# Patient Record
Sex: Female | Born: 1975 | Race: Black or African American | Hispanic: No | Marital: Married | Smoking: Never smoker
Health system: Southern US, Community
[De-identification: ages and names within clinical notes are randomized; demographics above are authoritative.]

## PROBLEM LIST (undated history)

## (undated) DIAGNOSIS — N83209 Unspecified ovarian cyst, unspecified side: Secondary | ICD-10-CM

## (undated) DIAGNOSIS — E282 Polycystic ovarian syndrome: Secondary | ICD-10-CM

## (undated) HISTORY — PX: TUBAL LIGATION: SHX77

## (undated) HISTORY — PX: DILATION AND CURETTAGE OF UTERUS: SHX78

## (undated) HISTORY — PX: WISDOM TOOTH EXTRACTION: SHX21

## (undated) HISTORY — PX: CERVICAL CERCLAGE: SHX1329

## (undated) HISTORY — DX: Polycystic ovarian syndrome: E28.2

---

## 1998-02-20 ENCOUNTER — Emergency Department (HOSPITAL_COMMUNITY): Admission: EM | Admit: 1998-02-20 | Discharge: 1998-02-20 | Payer: Self-pay | Admitting: Emergency Medicine

## 1998-06-07 ENCOUNTER — Other Ambulatory Visit: Admission: RE | Admit: 1998-06-07 | Discharge: 1998-06-07 | Payer: Self-pay | Admitting: Obstetrics and Gynecology

## 2001-12-13 ENCOUNTER — Other Ambulatory Visit: Admission: RE | Admit: 2001-12-13 | Discharge: 2001-12-13 | Payer: Self-pay | Admitting: Obstetrics and Gynecology

## 2003-09-15 ENCOUNTER — Other Ambulatory Visit: Admission: RE | Admit: 2003-09-15 | Discharge: 2003-09-15 | Payer: Self-pay | Admitting: Obstetrics and Gynecology

## 2004-02-02 ENCOUNTER — Ambulatory Visit (HOSPITAL_COMMUNITY): Admission: RE | Admit: 2004-02-02 | Discharge: 2004-02-02 | Payer: Self-pay | Admitting: Obstetrics and Gynecology

## 2004-02-16 ENCOUNTER — Ambulatory Visit (HOSPITAL_COMMUNITY): Admission: RE | Admit: 2004-02-16 | Discharge: 2004-02-16 | Payer: Self-pay | Admitting: Obstetrics and Gynecology

## 2004-02-19 ENCOUNTER — Inpatient Hospital Stay (HOSPITAL_COMMUNITY): Admission: AD | Admit: 2004-02-19 | Discharge: 2004-02-28 | Payer: Self-pay | Admitting: Obstetrics and Gynecology

## 2004-03-25 ENCOUNTER — Observation Stay (HOSPITAL_COMMUNITY): Admission: RE | Admit: 2004-03-25 | Discharge: 2004-03-26 | Payer: Self-pay | Admitting: Obstetrics and Gynecology

## 2004-09-16 ENCOUNTER — Other Ambulatory Visit: Admission: RE | Admit: 2004-09-16 | Discharge: 2004-09-16 | Payer: Self-pay | Admitting: Obstetrics and Gynecology

## 2004-10-28 ENCOUNTER — Ambulatory Visit (HOSPITAL_COMMUNITY): Admission: RE | Admit: 2004-10-28 | Discharge: 2004-10-28 | Payer: Self-pay | Admitting: Obstetrics and Gynecology

## 2004-11-23 ENCOUNTER — Ambulatory Visit (HOSPITAL_COMMUNITY): Admission: RE | Admit: 2004-11-23 | Discharge: 2004-11-23 | Payer: Self-pay | Admitting: Obstetrics and Gynecology

## 2004-12-12 ENCOUNTER — Encounter (HOSPITAL_COMMUNITY): Admission: AD | Admit: 2004-12-12 | Discharge: 2005-01-11 | Payer: Self-pay | Admitting: Obstetrics and Gynecology

## 2005-01-16 ENCOUNTER — Encounter (HOSPITAL_COMMUNITY): Admission: RE | Admit: 2005-01-16 | Discharge: 2005-02-15 | Payer: Self-pay | Admitting: Obstetrics and Gynecology

## 2005-02-20 ENCOUNTER — Encounter (HOSPITAL_COMMUNITY): Admission: RE | Admit: 2005-02-20 | Discharge: 2005-03-13 | Payer: Self-pay | Admitting: Obstetrics and Gynecology

## 2005-03-20 ENCOUNTER — Encounter (HOSPITAL_COMMUNITY): Admission: RE | Admit: 2005-03-20 | Discharge: 2005-04-19 | Payer: Self-pay | Admitting: Obstetrics and Gynecology

## 2005-03-31 ENCOUNTER — Inpatient Hospital Stay (HOSPITAL_COMMUNITY): Admission: AD | Admit: 2005-03-31 | Discharge: 2005-03-31 | Payer: Self-pay | Admitting: Obstetrics and Gynecology

## 2005-04-19 ENCOUNTER — Inpatient Hospital Stay (HOSPITAL_COMMUNITY): Admission: AD | Admit: 2005-04-19 | Discharge: 2005-04-19 | Payer: Self-pay | Admitting: Obstetrics and Gynecology

## 2005-04-26 ENCOUNTER — Inpatient Hospital Stay (HOSPITAL_COMMUNITY): Admission: AD | Admit: 2005-04-26 | Discharge: 2005-04-26 | Payer: Self-pay | Admitting: Obstetrics and Gynecology

## 2005-05-06 ENCOUNTER — Inpatient Hospital Stay (HOSPITAL_COMMUNITY): Admission: AD | Admit: 2005-05-06 | Discharge: 2005-05-06 | Payer: Self-pay | Admitting: Obstetrics and Gynecology

## 2005-05-24 ENCOUNTER — Inpatient Hospital Stay (HOSPITAL_COMMUNITY): Admission: AD | Admit: 2005-05-24 | Discharge: 2005-05-26 | Payer: Self-pay | Admitting: Obstetrics and Gynecology

## 2005-09-12 ENCOUNTER — Other Ambulatory Visit: Admission: RE | Admit: 2005-09-12 | Discharge: 2005-09-12 | Payer: Self-pay | Admitting: Obstetrics and Gynecology

## 2005-11-21 ENCOUNTER — Ambulatory Visit (HOSPITAL_COMMUNITY): Admission: RE | Admit: 2005-11-21 | Discharge: 2005-11-21 | Payer: Self-pay | Admitting: Obstetrics and Gynecology

## 2006-04-08 ENCOUNTER — Inpatient Hospital Stay (HOSPITAL_COMMUNITY): Admission: AD | Admit: 2006-04-08 | Discharge: 2006-04-08 | Payer: Self-pay | Admitting: Obstetrics and Gynecology

## 2006-04-11 ENCOUNTER — Inpatient Hospital Stay (HOSPITAL_COMMUNITY): Admission: AD | Admit: 2006-04-11 | Discharge: 2006-04-11 | Payer: Self-pay | Admitting: Obstetrics and Gynecology

## 2006-04-12 ENCOUNTER — Inpatient Hospital Stay (HOSPITAL_COMMUNITY): Admission: AD | Admit: 2006-04-12 | Discharge: 2006-04-12 | Payer: Self-pay | Admitting: Obstetrics and Gynecology

## 2006-05-08 ENCOUNTER — Inpatient Hospital Stay (HOSPITAL_COMMUNITY): Admission: RE | Admit: 2006-05-08 | Discharge: 2006-05-10 | Payer: Self-pay | Admitting: Obstetrics and Gynecology

## 2011-07-05 ENCOUNTER — Encounter (HOSPITAL_COMMUNITY): Payer: Self-pay | Admitting: *Deleted

## 2011-07-05 ENCOUNTER — Ambulatory Visit (HOSPITAL_COMMUNITY)
Admission: RE | Admit: 2011-07-05 | Discharge: 2011-07-05 | Disposition: A | Discharge status: Home / Self Care | Payer: BC Managed Care – PPO | Source: Ambulatory Visit | Attending: Family Medicine | Admitting: Family Medicine

## 2011-07-05 ENCOUNTER — Ambulatory Visit (INDEPENDENT_AMBULATORY_CARE_PROVIDER_SITE_OTHER): Payer: BC Managed Care – PPO

## 2011-07-05 ENCOUNTER — Inpatient Hospital Stay (HOSPITAL_COMMUNITY): Payer: BC Managed Care – PPO

## 2011-07-05 ENCOUNTER — Other Ambulatory Visit: Payer: Self-pay | Admitting: Family Medicine

## 2011-07-05 ENCOUNTER — Inpatient Hospital Stay (HOSPITAL_COMMUNITY)
Admission: AD | Admit: 2011-07-05 | Discharge: 2011-07-05 | Disposition: A | Discharge status: Home / Self Care | Payer: BC Managed Care – PPO | Source: Ambulatory Visit | Attending: Obstetrics & Gynecology | Admitting: Obstetrics & Gynecology

## 2011-07-05 DIAGNOSIS — R1031 Right lower quadrant pain: Secondary | ICD-10-CM | POA: Insufficient documentation

## 2011-07-05 DIAGNOSIS — R109 Unspecified abdominal pain: Secondary | ICD-10-CM

## 2011-07-05 DIAGNOSIS — N838 Other noninflammatory disorders of ovary, fallopian tube and broad ligament: Secondary | ICD-10-CM | POA: Insufficient documentation

## 2011-07-05 DIAGNOSIS — R112 Nausea with vomiting, unspecified: Secondary | ICD-10-CM | POA: Insufficient documentation

## 2011-07-05 DIAGNOSIS — R1084 Generalized abdominal pain: Secondary | ICD-10-CM

## 2011-07-05 HISTORY — DX: Unspecified ovarian cyst, unspecified side: N83.209

## 2011-07-05 LAB — URINALYSIS, ROUTINE W REFLEX MICROSCOPIC
Bilirubin Urine: NEGATIVE
Glucose, UA: NEGATIVE mg/dL
Hgb urine dipstick: NEGATIVE
Ketones, ur: NEGATIVE mg/dL
Nitrite: NEGATIVE
Protein, ur: NEGATIVE mg/dL
Specific Gravity, Urine: <1.005 — ABNORMAL LOW (ref 1.005–1.030)
Urobilinogen, UA: 1 mg/dL (ref 0.0–1.0)
pH: 7 (ref 5.0–8.0)

## 2011-07-05 MED ORDER — IOHEXOL 300 MG/ML  SOLN: 100.0000 mL | Freq: Once | INTRAMUSCULAR | Status: DC | PRN

## 2011-07-05 MED ORDER — HYDROCODONE-ACETAMINOPHEN 5-500 MG PO TABS: 1.0000 | ORAL_TABLET | Freq: Four times a day (QID) | ORAL | Status: AC | PRN

## 2011-07-05 MED ORDER — HYDROMORPHONE HCL PF 1 MG/ML IJ SOLN
1.0000 mg | Freq: Once | INTRAMUSCULAR | Status: AC
  Administered 2011-07-05: 1 mg via INTRAMUSCULAR
  Filled 2011-07-05: qty 1

## 2011-07-05 MED ORDER — KETOROLAC TROMETHAMINE 60 MG/2ML IM SOLN
60.0000 mg | Freq: Once | INTRAMUSCULAR | Status: AC
  Administered 2011-07-05: 60 mg via INTRAMUSCULAR
  Filled 2011-07-05: qty 2

## 2011-07-05 NOTE — Progress Notes (Signed)
Patient states she has some right lower abdominal pain about 3 weeks ago then went away. Started again yesterday at 1530 with increasing intensity since that time. Was seen at [place] today and had a CT a [employer]. Was sent to [facility]U for evaluation of an ovarian cyst on the right side.

## 2011-07-05 NOTE — ED Provider Notes (Signed)
History   Pt presents today c/o RLQ pain that has worsened over the past 2 days. She was seen earlier today in Urgent Care and was worked up for appendicitis which was negative. However, the CT scan demonstrated a possible 5cm ovarian cyst and she was sent to the MAU to r/o ovarian torsion. She denies vag dc, bleeding, fever, or any other sx at this time.  Chief Complaint  Patient presents with  . Abdominal Pain   HPI  OB History    Grav Para Term Preterm Abortions TAB SAB Ect Mult Living   4 4 3 1   0 0 0 0 3      Past Medical History  Diagnosis Date  . Ovarian cyst     Past Surgical History  Procedure Date  . Tubal ligation   . Dilation and curettage of uterus   . Wisdom tooth extraction   . Cervical cerclage     X  3 pregnancies    History reviewed. No pertinent family history.  History  Substance Use Topics  . Smoking status: Never Smoker   . Smokeless tobacco: Never Used  . Alcohol Use: No    Allergies:  Allergies  Allergen Reactions  . Pineapple Swelling    Tongue and lips swell but she eats sometimes anyway.  . Shellfish Allergy Swelling    Tongue and lip swelling    No prescriptions prior to admission    Review of Systems  Constitutional: Negative for fever.  Eyes: Negative for blurred vision and double vision.  Cardiovascular: Negative for chest pain and palpitations.  Gastrointestinal: Positive for nausea and abdominal pain. Negative for vomiting, diarrhea and constipation.  Genitourinary: Negative for dysuria, urgency, frequency and hematuria.  Neurological: Negative for dizziness and headaches.  Psychiatric/Behavioral: Negative for depression and suicidal ideas.   Physical Exam   Blood pressure 116/76, pulse 75, temperature 98.4 F (36.9 C), temperature source Oral, resp. rate 20, height 5' 7.5" (1.715 m), weight 277 lb (125.646 kg), last menstrual period 06/14/2011, SpO2 99.00%.  Physical Exam  Nursing note and vitals  reviewed. Constitutional: She is oriented to person, place, and time. She appears well-developed and well-nourished. No distress.  HENT:  Head: Normocephalic and atraumatic.  Eyes: EOM are normal. Pupils are equal, round, and reactive to light.  GI: Soft. She exhibits no distension and no mass. There is tenderness. There is no rebound and no guarding.  Neurological: She is alert and oriented to person, place, and time.  Skin: Skin is warm and dry. She is not diaphoretic.  Psychiatric: She has a normal mood and affect. Her behavior is normal. Judgment and thought content normal.    MAU Course  Procedures  Results for orders placed during the hospital encounter of 07/05/11 (from the past 24 hour(s))  URINALYSIS, ROUTINE W REFLEX MICROSCOPIC     Status: Abnormal   Collection Time   07/05/11  5:00 PM      Component Value Range   Color, Urine YELLOW  YELLOW    APPearance HAZY (*) CLEAR    Specific Gravity, Urine <1.005 (*) 1.005 - 1.030    pH 7.0  5.0 - 8.0    Glucose, UA NEGATIVE  NEGATIVE (mg/dL)   Hgb urine dipstick NEGATIVE  NEGATIVE    Bilirubin Urine NEGATIVE  NEGATIVE    Ketones, ur NEGATIVE  NEGATIVE (mg/dL)   Protein, ur NEGATIVE  NEGATIVE (mg/dL)   Urobilinogen, UA 1.0  0.0 - 1.0 (mg/dL)   Nitrite NEGATIVE  NEGATIVE  Leukocytes, UA SMALL (*) NEGATIVE   URINE MICROSCOPIC-ADD ON     Status: Abnormal   Collection Time   07/05/11  5:00 PM      Component Value Range   Squamous Epithelial / LPF MANY (*) RARE    WBC, UA 0-2  <3 (WBC/hpf)   Bacteria, UA FEW (*) RARE     US Transvaginal Non-ob  07/05/2011  *RADIOLOGY REPORT*  Clinical Data: Pelvic pain.  TRANSABDOMINAL AND TRANSVAGINAL ULTRASOUND OF PELVIS Technique:  Both transabdominal and transvaginal ultrasound examinations of the pelvis were performed. Transabdominal technique was performed for global imaging of the pelvis including uterus, ovaries, adnexal regions, and pelvic cul-de-sac.  Comparison: CT 07/05/2011   It was  necessary to proceed with endovaginal exam following the transabdominal exam to visualize the both ovaries and uterus.  Findings:  Uterus: 9.6 x 4.9 x 6.5 cm.  Normal appearing uterus.  Endometrium: Tri layered endometrium measuring 10 mm  Right ovary:  The right ovary is located anteriorly as seen on the CT.  Right ovary measures 5.3 x 3.6 x 3.9 cm with multiple follicles.  No cyst or mass.  Left ovary: 5.5 x 1.8 x 2.7 cm.  Multiple follicles without cyst.  Other findings: No free fluid  IMPRESSION: Prominent sized ovaries bilaterally with multiple follicles.  No cyst or mass.  No free fluid.  Original Report Authenticated By: Camelia Phenes, M.D.   US Pelvis Complete  07/05/2011  *RADIOLOGY REPORT*  Clinical Data: Pelvic pain.  TRANSABDOMINAL AND TRANSVAGINAL ULTRASOUND OF PELVIS Technique:  Both transabdominal and transvaginal ultrasound examinations of the pelvis were performed. Transabdominal technique was performed for global imaging of the pelvis including uterus, ovaries, adnexal regions, and pelvic cul-de-sac.  Comparison: CT 07/05/2011   It was necessary to proceed with endovaginal exam following the transabdominal exam to visualize the both ovaries and uterus.  Findings:  Uterus: 9.6 x 4.9 x 6.5 cm.  Normal appearing uterus.  Endometrium: Tri layered endometrium measuring 10 mm  Right ovary:  The right ovary is located anteriorly as seen on the CT.  Right ovary measures 5.3 x 3.6 x 3.9 cm with multiple follicles.  No cyst or mass.  Left ovary: 5.5 x 1.8 x 2.7 cm.  Multiple follicles without cyst.  Other findings: No free fluid  IMPRESSION: Prominent sized ovaries bilaterally with multiple follicles.  No cyst or mass.  No free fluid.  Original Report Authenticated By: Camelia Phenes, M.D.   Ct Abdomen Pelvis W Contrast  07/05/2011  *RADIOLOGY REPORT*  Clinical Data: Right lower quadrant abdominal pain for 2 days, nausea, vomiting, question appendicitis  CT ABDOMEN AND PELVIS WITH CONTRAST  Technique:   Multidetector CT imaging of the abdomen and pelvis was performed following the standard protocol during bolus administration of intravenous contrast. Sagittal and coronal MPR images reconstructed from axial data set.  Contrast:  100 ml Omnipaque 300 IV. Dilute oral contrast.  Comparison: None  Findings: Lung bases clear. Liver, spleen, pancreas, kidneys, and adrenal glands normal. Normal appendix. Bladder, ureters, uterus, and left adnexa normal appearance. Enlargement of right ovary question containing a cyst or mass 5.1 x 3.3 x 3.6 cm. Tiny amount of free intraperitoneal fluid adjacent to uterus. No additional intra abdominal mass, adenopathy, or inflammatory process. Bones unremarkable.  IMPRESSION: No evidence of appendicitis. Enlarged right ovary question containing a cyst or mass 5.1 x 3.3 x 3.6 cm in size. Recommend characterization by pelvic and transvaginal sonography.  Original Report Authenticated By: Redge Gainer.  Tyron Russell, M.D.     Assessment and Plan  Abd pain: pt most likely with abd muscle strain. Her [place] and labs are NL. CBC from urgent care NL. Will give Rx for vicodin. She will f/u with her PCP. Discussed diet, activity, risks, and precautions.  Clinton Gallant. Rice III, DrHSc, MPAS, PA-C  07/05/2011, 5:55 PM   Henrietta Hoover, PA 07/05/11 1947

## 2011-07-05 NOTE — ED Notes (Signed)
Patient has not been seen by Dr. Normand Sloop for several years, so patient reassigned to [facility]. Informed patient that she may choose to reestablished care with Dr. Normand Sloop at her convenience.

## 2014-04-20 ENCOUNTER — Encounter (HOSPITAL_COMMUNITY): Payer: Self-pay | Admitting: *Deleted

## 2018-04-04 ENCOUNTER — Other Ambulatory Visit: Payer: Self-pay

## 2018-04-04 ENCOUNTER — Encounter: Payer: Self-pay | Admitting: Family Medicine

## 2018-04-04 ENCOUNTER — Ambulatory Visit: Payer: BC Managed Care – PPO | Admitting: Family Medicine

## 2018-04-04 VITALS — BP 118/88 | HR 75 | Temp 97.9°F | Ht 67.5 in | Wt 274.6 lb

## 2018-04-04 DIAGNOSIS — B9789 Other viral agents as the cause of diseases classified elsewhere: Secondary | ICD-10-CM

## 2018-04-04 DIAGNOSIS — J028 Acute pharyngitis due to other specified organisms: Secondary | ICD-10-CM | POA: Diagnosis not present

## 2018-04-04 DIAGNOSIS — Z23 Encounter for immunization: Secondary | ICD-10-CM | POA: Diagnosis not present

## 2018-04-04 DIAGNOSIS — J029 Acute pharyngitis, unspecified: Secondary | ICD-10-CM

## 2018-04-04 NOTE — Patient Instructions (Signed)
Please return in 2 weeks for your physical.   It was a pleasure meeting you today! Thank you for choosing [place] to meet your healthcare needs! I truly look forward to working with you. If you have any questions or concerns, please send me a message via Mychart or call the office at [phone].   Pharyngitis Pharyngitis is redness, pain, and swelling (inflammation) of the throat (pharynx). It is a very common cause of sore throat. Pharyngitis can be caused by a bacteria, but it is usually caused by a virus. Most cases of pharyngitis get better on their own without treatment. What are the causes? This condition may be caused by:  Infection by viruses (viral). Viral pharyngitis spreads from person to person (is contagious) through coughing, sneezing, and sharing of personal items or utensils such as cups, forks, spoons, and toothbrushes.  Infection by bacteria (bacterial). Bacterial pharyngitis may be spread by touching the nose or face after coming in contact with the bacteria, or through more intimate contact, such as kissing.  Allergies. Allergies can cause buildup of mucus in the throat (post-nasal drip), leading to inflammation and irritation. Allergies can also cause blocked nasal passages, forcing breathing through the mouth, which dries and irritates the throat.  What increases the risk? You are more likely to develop this condition if:  You are 52-34 years old.  You are exposed to crowded environments such as daycare, school, or dormitory living.  You live in a cold climate.  You have a weakened disease-fighting (immune) system.  What are the signs or symptoms? Symptoms of this condition vary by the cause (viral, bacterial, or allergies) and can include:  Sore throat.  Fatigue.  Low-grade fever.  Headache.  Joint pain and muscle aches.  Skin rashes.  Swollen glands in the throat (lymph nodes).  Plaque-like film on the throat or tonsils. This is often a symptom of  bacterial pharyngitis.  Vomiting.  Stuffy nose (nasal congestion).  Cough.  Red, itchy eyes (conjunctivitis).  Loss of appetite.  How is this diagnosed? This condition is often diagnosed based on your medical history and a physical exam. Your health care provider will ask you questions about your illness and your symptoms. A swab of your throat may be done to check for bacteria (rapid strep test). Other lab tests may also be done, depending on the suspected cause, but these are rare. How is this treated? This condition usually gets better in 3-4 days without medicine. Bacterial pharyngitis may be treated with antibiotic medicines. Follow these instructions at home:  Take over-the-counter and prescription medicines only as told by your health care provider. ? If you were prescribed an antibiotic medicine, take it as told by your health care provider. Do not stop taking the antibiotic even if you start to feel better. ? Do not give children aspirin because of the association with Reye syndrome.  Drink enough water and fluids to keep your urine clear or pale yellow.  Get a lot of rest.  Gargle with a salt-water mixture 3-4 times a day or as needed. To make a salt-water mixture, completely dissolve -1 tsp of salt in 1 cup of warm water.  If your health care provider approves, you may use throat lozenges or sprays to soothe your throat. Contact a health care provider if:  You have large, tender lumps in your neck.  You have a rash.  You cough up green, yellow-brown, or bloody spit. Get help right away if:  Your neck becomes stiff.  You drool or are unable to swallow liquids.  You cannot drink or take medicines without vomiting.  You have severe pain that does not go away, even after you take medicine.  You have trouble breathing, and it is not caused by a stuffy nose.  You have new pain and swelling in your joints such as the knees, ankles, wrists, or  elbows. Summary  Pharyngitis is redness, pain, and swelling (inflammation) of the throat (pharynx).  While pharyngitis can be caused by a bacteria, the most common causes are viral.  Most cases of pharyngitis get better on their own without treatment.  Bacterial pharyngitis is treated with antibiotic medicines. This information is not intended to replace advice given to you by your health care provider. Make sure you discuss any questions you have with your health care provider. Document Released: 06/05/2005 Document Revised: 07/11/2016 Document Reviewed: 07/11/2016 Elsevier Interactive Patient Education  [employer].

## 2018-04-04 NOTE — Progress Notes (Signed)
Subjective  CC:  Chief Complaint  Patient presents with  . Sore Throat    started on saturday, felt like a rock in her throat, she has been taking tylenol     HPI: Erica Baxter is a 42 y.o. female who presents to [facility] at [facility] today to establish care with me as a new patient.    She has the following concerns or needs:  Very pleasant 42 year old female, mother of 3 children, married presents due to recent sore throat.  Started with severe sore throat that lasted multiple days.  Now improving.  Responsive to Tylenol.  No fevers or chills.  No cough or URI symptoms.  No known strep or mono exposure.  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  Happy, well adjusted.  Overdue for health maintenance.  Assessment  1. Acute viral pharyngitis      Plan   Supportive care.  Reassured.  Improving.  Flu shot today  Return for complete physical  Follow up:  Return in about 2 weeks (around 04/18/2018) for complete physical. No orders of the defined types were placed in this encounter.  No orders of the defined types were placed in this encounter.    Depression screen PHQ 2/9 04/04/2018  Decreased Interest 0  Down, Depressed, Hopeless 0  PHQ - 2 Score 0    We updated and reviewed the patient's past history in detail and it is documented below.  There are no active problems to display for this patient.  Health Maintenance  Topic Date Due  . HIV Screening  08/21/1992  . TETANUS/TDAP  08/21/1996  . PAP SMEAR  08/22/1998  . INFLUENZA VACCINE  01/17/2018    There is no immunization history on file for this patient. No outpatient medications have been marked as taking for the 04/04/18 encounter (Office Visit) with Willow Ora, MD.    Allergies: Patient is allergic to pineapple and shellfish allergy. Past Medical History Patient  has a past medical history of Ovarian cyst. Past Surgical History Patient  has a past  surgical history that includes Tubal ligation; Dilation and curettage of uterus; Wisdom tooth extraction; and Cervical cerclage. Family History: Patient family history includes Asthma in her daughter and mother; COPD in her mother; Depression in her brother and mother; Diabetes in her mother and paternal grandmother; Early death in her mother; Heart attack in her son; Heart disease in her paternal grandfather; Prostate cancer in her maternal grandfather. Social History:  Patient  reports that she has never smoked. She has never used smokeless tobacco. She reports that she does not drink alcohol or use drugs.  Review of Systems: Constitutional: negative for fever or malaise Ophthalmic: negative for photophobia, double vision or loss of vision Cardiovascular: negative for chest pain, dyspnea on exertion, or new LE swelling Respiratory: negative for SOB or persistent cough Gastrointestinal: negative for abdominal pain, change in bowel habits or melena Genitourinary: negative for dysuria or gross hematuria Musculoskeletal: negative for new gait disturbance or muscular weakness Integumentary: negative for new or persistent rashes Neurological: negative for TIA or stroke symptoms Psychiatric: negative for SI or delusions Allergic/Immunologic: negative for hives  Patient Care Team    Relationship Specialty Notifications Start End  Willow Ora, MD PCP - General Family Medicine  04/04/18     Objective  Vitals: BP 118/88   Pulse 75   Temp 97.9 F (36.6 C)   Ht 5' 7.5" (1.715 m)  Wt 274 lb 9.6 oz (124.6 kg)   SpO2 97%   BMI 42.37 kg/m  General:  Well developed, well nourished, no acute distress  Psych:  Alert and oriented,normal mood and affect HEENT:  Normocephalic, atraumatic, non-icteric sclera, PERRL, oropharynx is without mass or exudate and is mildly erythematous, supple neck without adenopathy, mass or thyromegaly Cardiovascular:  RRR without gallop, rub or murmur, nondisplaced  PMI Respiratory:  Good breath sounds bilaterally, CTAB with normal respiratory effort    Commons side effects, risks, benefits, and alternatives for medications and treatment plan prescribed today were discussed, and the patient expressed understanding of the given instructions. Patient is instructed to call or message via MyChart if he/she has any questions or concerns regarding our treatment plan. No barriers to understanding were identified. We discussed Red Flag symptoms and signs in detail. Patient expressed understanding regarding what to do in case of urgent or emergency type symptoms.   Medication list was reconciled, printed and provided to the patient in AVS. Patient instructions and summary information was reviewed with the patient as documented in the AVS. This note was prepared with assistance of Dragon voice recognition software. Occasional wrong-word or sound-a-like substitutions may have occurred due to the inherent limitations of voice recognition software

## 2018-04-16 ENCOUNTER — Ambulatory Visit: Payer: BC Managed Care – PPO | Admitting: Family Medicine

## 2018-04-22 ENCOUNTER — Ambulatory Visit: Payer: BC Managed Care – PPO | Admitting: Family Medicine

## 2018-04-22 ENCOUNTER — Encounter: Payer: Self-pay | Admitting: Family Medicine

## 2018-04-22 ENCOUNTER — Other Ambulatory Visit (HOSPITAL_COMMUNITY)
Admission: RE | Admit: 2018-04-22 | Discharge: 2018-04-22 | Disposition: A | Discharge status: Home / Self Care | Payer: BC Managed Care – PPO | Source: Ambulatory Visit | Attending: Family Medicine | Admitting: Family Medicine

## 2018-04-22 ENCOUNTER — Other Ambulatory Visit: Payer: Self-pay

## 2018-04-22 VITALS — BP 126/84 | HR 82 | Temp 97.9°F | Ht 67.5 in | Wt 274.8 lb

## 2018-04-22 DIAGNOSIS — Z0000 Encounter for general adult medical examination without abnormal findings: Secondary | ICD-10-CM | POA: Insufficient documentation

## 2018-04-22 DIAGNOSIS — Z124 Encounter for screening for malignant neoplasm of cervix: Secondary | ICD-10-CM

## 2018-04-22 DIAGNOSIS — Z1239 Encounter for other screening for malignant neoplasm of breast: Secondary | ICD-10-CM

## 2018-04-22 DIAGNOSIS — M2141 Flat foot [pes planus] (acquired), right foot: Secondary | ICD-10-CM

## 2018-04-22 DIAGNOSIS — E282 Polycystic ovarian syndrome: Secondary | ICD-10-CM | POA: Diagnosis not present

## 2018-04-22 DIAGNOSIS — M2142 Flat foot [pes planus] (acquired), left foot: Secondary | ICD-10-CM

## 2018-04-22 DIAGNOSIS — M2140 Flat foot [pes planus] (acquired), unspecified foot: Secondary | ICD-10-CM | POA: Insufficient documentation

## 2018-04-22 NOTE — Patient Instructions (Signed)
Please return in 12 months for your annual complete physical; please come fasting.  Work on sleep hygiene and calming the mind.   If you have any questions or concerns, please don't hesitate to send me a message via MyChart or call the office at [phone]. Thank you for visiting with [place] today! It's our pleasure caring for you.  Please do these things to maintain good health!   Exercise at least 30-45 minutes a day,  4-5 days a week.   Eat a low-fat diet with lots of fruits and vegetables, up to 7-9 servings per day.  Drink plenty of water daily. Try to drink 8 8oz glasses per day.  Seatbelts can save your life. Always wear your seatbelt.  Place Smoke Detectors on every level of your home and check batteries every year.  Schedule an appointment with an eye doctor for an eye exam every 1-2 years  Safe sex - use condoms to protect yourself from STDs if you could be exposed to these types of infections. Use birth control if you do not want to become pregnant and are sexually active.  Avoid heavy alcohol use. If you drink, keep it to less than 2 drinks/day and not every day.  Health Care Power of Attorney.  Choose someone you trust that could speak for you if you became unable to speak for yourself.  Depression is common in our stressful world.If you're feeling down or losing interest in things you normally enjoy, please come in for a visit.  If anyone is threatening or hurting you, please get help. Physical or Emotional Violence is never OK.

## 2018-04-22 NOTE — Progress Notes (Signed)
Subjective  Chief Complaint  Patient presents with  . Establish Care    never had a PCP, last physical in 2008  . Annual Exam    pap today, due for mammogram, tdap today, patient is not fasting today     HPI: Erica Baxter is a 42 y.o. female who presents to [facility] today for a Female Wellness Visit.   Wellness Visit: annual visit with health maintenance review and exam with Pap   Doing well. Admits to some trouble sleeping at night: awakens thinking of plans for the next day. Falls alseep ok. Limits working at night and TV. No mood issues. Denies snoring.   Due for pap, mammo and labs.  Assessment  1. Annual physical exam   2. Cervical cancer screening   3. Breast cancer screening   4. PCOS (polycystic ovarian syndrome)   5. Pes planus of both feet      Plan  Female Wellness Visit:  Age appropriate Health Maintenance and Prevention measures were discussed with patient. Included topics are cancer screening recommendations, ways to keep healthy (see AVS) including dietary and exercise recommendations, regular eye and dental care, use of seat belts, and avoidance of moderate alcohol use and tobacco use.   BMI: discussed patient's BMI and encouraged positive lifestyle modifications to help get to or maintain a target BMI.  HM needs and immunizations were addressed and ordered. See below for orders. See HM and immunization section for updates. Needs tdap  Routine labs and screening tests ordered including cmp, cbc and lipids where appropriate.  Discussed recommendations regarding Vit D and calcium supplementation (see AVS)    Follow up: Return in about 1 year (around 04/23/2019) for complete physical.   Orders Placed This Encounter  Procedures  . MM Digital Screening  . CBC with Differential/Platelet  . Comprehensive metabolic panel  . Lipid panel  . HIV Antibody (routine testing w rflx)   No orders of the defined types were placed  in this encounter.     Lifestyle: Body mass index is 42.4 kg/m. Wt Readings from Last 3 Encounters:  04/22/18 274 lb 12.8 oz (124.6 kg)  04/04/18 274 lb 9.6 oz (124.6 kg)  07/05/11 277 lb (125.6 kg)   Diet: general Exercise: daily,  Need for contraception: No, tubal ligation  Patient Active Problem List   Diagnosis Date Noted  . Annual physical exam 04/22/2018  . PCOS (polycystic ovarian syndrome) 04/22/2018  . Pes planus 04/22/2018   Health Maintenance  Topic Date Due  . HIV Screening  08/21/1992  . MAMMOGRAM  08/22/1995  . TETANUS/TDAP  08/21/1996  . PAP SMEAR  08/22/1998  . INFLUENZA VACCINE  Completed   Immunization History  Administered Date(s) Administered  . Influenza,inj,Quad PF,6+ Mos 04/04/2018   We updated and reviewed the patient's past history in detail and it is documented below. Allergies: Patient  reports that she does not drink alcohol. Past Medical History Patient  has a past medical history of Ovarian cyst. Past Surgical History Patient  has a past surgical history that includes Tubal ligation; Dilation and curettage of uterus; Wisdom tooth extraction; and Cervical cerclage. Social History   Socioeconomic History  . Marital status: Married    Spouse name: Not on file  . Number of children: Not on file  . Years of education: Not on file  . Highest education level: Not on file  Occupational History  . Not on file  Social Needs  . Physicist, medical  strain: Not on file  . Food insecurity:    Worry: Not on file    Inability: Not on file  . Transportation needs:    Medical: Not on file    Non-medical: Not on file  Tobacco Use  . Smoking status: Never Smoker  . Smokeless tobacco: Never Used  Substance and Sexual Activity  . Alcohol use: No  . Drug use: No  . Sexual activity: Yes    Birth control/protection: Surgical  Lifestyle  . Physical activity:    Days per week: Not on file    Minutes per session: Not on file  . Stress: Not on  file  Relationships  . Social connections:    Talks on phone: Not on file    Gets together: Not on file    Attends religious service: Not on file    Active member of club or organization: Not on file    Attends meetings of clubs or organizations: Not on file    Relationship status: Not on file  Other Topics Concern  . Not on file  Social History Narrative  . Not on file   Family History  Problem Relation Age of Onset  . Asthma Mother   . COPD Mother   . Depression Mother   . Diabetes Mother   . Early death Mother   . Depression Brother   . Prostate cancer Maternal Grandfather   . Asthma Daughter   . Heart attack Son   . Diabetes Paternal Grandmother   . Heart disease Paternal Grandfather     Review of Systems: Constitutional: negative for fever or malaise Ophthalmic: negative for photophobia, double vision or loss of vision Cardiovascular: negative for chest pain, dyspnea on exertion, or new LE swelling Respiratory: negative for SOB or persistent cough Gastrointestinal: negative for abdominal pain, change in bowel habits or melena Genitourinary: negative for dysuria or gross hematuria, no abnormal uterine bleeding or disharge Musculoskeletal: negative for new gait disturbance or muscular weakness Integumentary: negative for new or persistent rashes, no breast lumps Neurological: negative for TIA or stroke symptoms Psychiatric: negative for SI or delusions Allergic/Immunologic: negative for hives  Patient Care Team    Relationship Specialty Notifications Start End  Willow Ora, MD PCP - General Family Medicine  04/22/18     Objective  Vitals: BP 126/84   Pulse 82   Temp 97.9 F (36.6 C)   Ht 5' 7.5" (1.715 m)   Wt 274 lb 12.8 oz (124.6 kg)   LMP 04/08/2018   SpO2 99%   BMI 42.40 kg/m  General:  Well developed, well nourished, no acute distress  Psych:  Alert and orientedx3,normal mood and affect HEENT:  Normocephalic, atraumatic, non-icteric sclera,  PERRL, oropharynx is clear without mass or exudate, supple neck without adenopathy, mass or thyromegaly Cardiovascular:  Normal S1, S2, RRR without gallop, rub or murmur, nondisplaced PMI Respiratory:  Good breath sounds bilaterally, CTAB with normal respiratory effort Gastrointestinal: normal bowel sounds, soft, non-tender, no noted masses. No HSM MSK: no deformities, contusions. Joints are without erythema or swelling. Spine and CVA region are nontender Skin:  Warm, no rashes or suspicious lesions noted, hirsuitism on chin Neurologic:    Mental status is normal. CN 2-11 are normal. Gross motor and sensory exams are normal. Normal gait. No tremor Breast Exam: No mass, skin retraction or nipple discharge is appreciated in either breast. No axillary adenopathy. Fibrocystic changes are not noted Pelvic Exam: Normal external genitalia, no vulvar or vaginal lesions present.  Clear cervix w/o CMT. Bimanual exam reveals a nontender fundus w/o masses, nl size. No adnexal masses present. No inguinal adenopathy. A PAP smear was performed.     Commons side effects, risks, benefits, and alternatives for medications and treatment plan prescribed today were discussed, and the patient expressed understanding of the given instructions. Patient is instructed to call or message via MyChart if he/she has any questions or concerns regarding our treatment plan. No barriers to understanding were identified. We discussed Red Flag symptoms and signs in detail. Patient expressed understanding regarding what to do in case of urgent or emergency type symptoms.   Medication list was reconciled, printed and provided to the patient in AVS. Patient instructions and summary information was reviewed with the patient as documented in the AVS. This note was prepared with assistance of Dragon voice recognition software. Occasional wrong-word or sound-a-like substitutions may have occurred due to the inherent limitations of voice  recognition software

## 2018-04-23 LAB — COMPREHENSIVE METABOLIC PANEL
ALT: 8 U/L (ref 0–35)
AST: 11 U/L (ref 0–37)
Albumin: 4.2 g/dL (ref 3.5–5.2)
Alkaline Phosphatase: 55 U/L (ref 39–117)
BUN: 10 mg/dL (ref 6–23)
CHLORIDE: 104 meq/L (ref 96–112)
CO2: 26 mEq/L (ref 19–32)
Calcium: 9 mg/dL (ref 8.4–10.5)
Creatinine, Ser: 0.87 mg/dL (ref 0.40–1.20)
GFR: 92.43 mL/min (ref >60.00)
GLUCOSE: 71 mg/dL (ref 70–99)
Potassium: 3.5 mEq/L (ref 3.5–5.1)
SODIUM: 138 meq/L (ref 135–145)
Total Bilirubin: 0.3 mg/dL (ref 0.2–1.2)
Total Protein: 7.4 g/dL (ref 6.0–8.3)

## 2018-04-23 LAB — CBC WITH DIFFERENTIAL/PLATELET
BASOS PCT: 2.7 % (ref 0.0–3.0)
Basophils Absolute: 0.1 10*3/uL (ref 0.0–0.1)
EOS PCT: 1.2 % (ref 0.0–5.0)
Eosinophils Absolute: 0.1 10*3/uL (ref 0.0–0.7)
HCT: 28.7 % — ABNORMAL LOW (ref 36.0–46.0)
Hemoglobin: 8.6 g/dL — ABNORMAL LOW (ref 12.0–15.0)
LYMPHS ABS: 1.8 10*3/uL (ref 0.7–4.0)
Lymphocytes Relative: 33.8 % (ref 12.0–46.0)
MCHC: 30 g/dL (ref 30.0–36.0)
MCV: 63.9 fl — ABNORMAL LOW (ref 78.0–100.0)
Monocytes Absolute: 0.5 10*3/uL (ref 0.1–1.0)
Monocytes Relative: 8.6 % (ref 3.0–12.0)
NEUTROS PCT: 53.7 % (ref 43.0–77.0)
Neutro Abs: 2.9 10*3/uL (ref 1.4–7.7)
PLATELETS: 411 10*3/uL — AB (ref 150.0–400.0)
RBC: 4.49 Mil/uL (ref 3.87–5.11)
RDW: 18.8 % — AB (ref 11.5–15.5)
WBC: 5.4 10*3/uL (ref 4.0–10.5)

## 2018-04-23 LAB — LIPID PANEL
CHOLESTEROL: 133 mg/dL (ref 0–200)
HDL: 41.3 mg/dL (ref >39.00)
LDL CALC: 76 mg/dL (ref 0–99)
NONHDL: 91.82
Total CHOL/HDL Ratio: 3
Triglycerides: 77 mg/dL (ref 0.0–149.0)
VLDL: 15.4 mg/dL (ref 0.0–40.0)

## 2018-04-23 LAB — HIV ANTIBODY (ROUTINE TESTING W REFLEX): HIV: NONREACTIVE

## 2018-04-24 LAB — CYTOLOGY - PAP
Diagnosis: NEGATIVE
HPV: NOT DETECTED

## 2018-04-25 NOTE — Progress Notes (Signed)
Please call patient: I have reviewed his/her lab results. Most all labs are fine, including pap smear. We can repeat your pap in 5 years. However, she has a significant anemia, likely due to menstrual blood loss. Please start iron 2-3x/day and schedule a repeat office visit in 8 weeks to discuss and recheck blood counts. Her hgb is is 8.6 with > 12 being normal.   (at f/u, consider TVUS)

## 2018-04-29 ENCOUNTER — Ambulatory Visit
Admission: RE | Admit: 2018-04-29 | Discharge: 2018-04-29 | Disposition: A | Discharge status: Home / Self Care | Payer: BC Managed Care – PPO | Source: Ambulatory Visit | Attending: Family Medicine | Admitting: Family Medicine

## 2018-04-29 DIAGNOSIS — Z1239 Encounter for other screening for malignant neoplasm of breast: Secondary | ICD-10-CM

## 2018-05-31 ENCOUNTER — Ambulatory Visit: Payer: BC Managed Care – PPO | Admitting: Family Medicine

## 2018-05-31 ENCOUNTER — Encounter: Payer: Self-pay | Admitting: Family Medicine

## 2018-05-31 ENCOUNTER — Other Ambulatory Visit: Payer: Self-pay

## 2018-05-31 VITALS — BP 124/82 | HR 76 | Temp 97.9°F | Ht 67.5 in | Wt 273.8 lb

## 2018-05-31 DIAGNOSIS — D500 Iron deficiency anemia secondary to blood loss (chronic): Secondary | ICD-10-CM | POA: Diagnosis not present

## 2018-05-31 DIAGNOSIS — Z23 Encounter for immunization: Secondary | ICD-10-CM | POA: Diagnosis not present

## 2018-05-31 DIAGNOSIS — N920 Excessive and frequent menstruation with regular cycle: Secondary | ICD-10-CM

## 2018-05-31 MED ORDER — POLYSACCHARIDE IRON COMPLEX 150 MG PO CAPS: 150.0000 mg | ORAL_CAPSULE | Freq: Two times a day (BID) | ORAL | 3 refills | Status: DC

## 2018-05-31 NOTE — Progress Notes (Signed)
Subjective  CC:  Chief Complaint  Patient presents with  . Anemia    recheck Anemia, doing well, no complaints    HPI: Erica Baxter is a 42 y.o. female who presents to the office today to address the problems listed above in the chief complaint.  Erica Baxter returns for follow-up of her microcytic anemia thought related heavy regular periods for many years.  She was told to take over-the-counter iron 2-3 times daily however was only able to get iron in once every 2 to 3 days.  She is not good at remembering taking daily medications.  She has had anemia after an IUFD requiring dilatation and curettage and a transfusion.  Since she had 2 normal pregnancies.  Never has been told she has been anemic since.  Has not had routine care for multiple years however.  Feels well.  Energy level is good.  She does admit to getting tired in the afternoon at times.  She denies pelvic pain.  Assessment  1. Iron deficiency anemia due to chronic blood loss   2. Menorrhagia with regular cycle   3. Need for Tdap vaccination      Plan   Iron deficiency anemia: Education and counseling given.  Presumed due to chronic blood loss.  Recommend Niferex 150 mg twice daily and rechecking blood counts in 8 weeks.  Check iron levels, vitamin B12 and folate today.  Discussed heavy menstrual cycles and management strategies.  Patient would prefer not to have to take oral medications or use hormones.  Recommend referral to GYN for further evaluation, and possible endometrial ablation.  First will order transvaginal ultrasound looking for fibroids Updated Tdap today Praised for weight loss. Follow up: 8 weeks to recheck anemia  Orders Placed This Encounter  Procedures  . US Pelvic Complete With Transvaginal  . Tdap vaccine greater than or equal to 7yo IM  . Anemia Profile B  . Ambulatory referral to Obstetrics / Gynecology   Meds ordered this encounter  Medications  . iron polysaccharides (NIFEREX) 150 MG capsule     Sig: Take 1 capsule (150 mg total) by mouth 2 (two) times daily.    Dispense:  60 capsule    Refill:  3      I reviewed the patients updated PMH, FH, and SocHx.    Patient Active Problem List   Diagnosis Date Noted  . Annual physical exam 04/22/2018  . PCOS (polycystic ovarian syndrome) 04/22/2018  . Pes planus 04/22/2018   No outpatient medications have been marked as taking for the 05/31/18 encounter (Office Visit) with Willow Ora, MD.    Allergies: Patient is allergic to pineapple and shellfish allergy. Family History: Patient family history includes Asthma in her daughter and mother; COPD in her mother; Depression in her brother and mother; Diabetes in her mother and paternal grandmother; Early death in her mother; Heart attack in her son; Heart disease in her paternal grandfather; Prostate cancer in her maternal grandfather. Social History:  Patient  reports that she has never smoked. She has never used smokeless tobacco. She reports that she does not drink alcohol or use drugs.  Review of Systems: Constitutional: Negative for fever malaise or anorexia Cardiovascular: negative for chest pain Respiratory: negative for SOB or persistent cough Gastrointestinal: negative for abdominal pain  Objective  Vitals: BP 124/82   Pulse 76   Temp 97.9 F (36.6 C)   Ht 5' 7.5" (1.715 m)   Wt 273 lb 12.8 oz (124.2 kg)  BMI 42.25 kg/m  General: no acute distress , A&Ox3 HEENT: PEERL, conjunctiva pale, Oropharynx moist,neck is supple Cardiovascular:  RRR without murmur or gallop.,  No peripheral edema Respiratory:  Good breath sounds bilaterally, CTAB with normal respiratory effort Skin:  Warm, no rashes  No visits with results within 1 Day(s) from this visit.  Latest known visit with results is:  Office Visit on 04/22/2018  Component Date Value Ref Range Status  . Adequacy 04/22/2018 Satisfactory for evaluation  endocervical/transformation zone component PRESENT.    Final  . Diagnosis 04/22/2018 NEGATIVE FOR INTRAEPITHELIAL LESIONS OR MALIGNANCY.   Final  . HPV 04/22/2018 NOT DETECTED   Final  . Material Submitted 04/22/2018 CervicoVaginal Pap [ThinPrep Imaged]   Final  . WBC 04/22/2018 5.4  4.0 - 10.5 K/uL Final  . RBC 04/22/2018 4.49  3.87 - 5.11 Mil/uL Final  . Hemoglobin 04/22/2018 8.6 Repeated and verified X2.* 12.0 - 15.0 g/dL Final  . HCT 16/10/960411/09/2017 28.7* 36.0 - 46.0 % Final  . MCV 04/22/2018 63.9 Repeated and verified X2.* 78.0 - 100.0 fl Final  . MCHC 04/22/2018 30.0  30.0 - 36.0 g/dL Final  . RDW 54/09/811911/09/2017 18.8* 11.5 - 15.5 % Final  . Platelets 04/22/2018 411.0* 150.0 - 400.0 K/uL Final  . Neutrophils Relative % 04/22/2018 53.7  43.0 - 77.0 % Final  . Lymphocytes Relative 04/22/2018 33.8  12.0 - 46.0 % Final  . Monocytes Relative 04/22/2018 8.6  3.0 - 12.0 % Final  . Eosinophils Relative 04/22/2018 1.2  0.0 - 5.0 % Final  . Basophils Relative 04/22/2018 2.7  0.0 - 3.0 % Final  . Neutro Abs 04/22/2018 2.9  1.4 - 7.7 K/uL Final  . Lymphs Abs 04/22/2018 1.8  0.7 - 4.0 K/uL Final  . Monocytes Absolute 04/22/2018 0.5  0.1 - 1.0 K/uL Final  . Eosinophils Absolute 04/22/2018 0.1  0.0 - 0.7 K/uL Final  . Basophils Absolute 04/22/2018 0.1  0.0 - 0.1 K/uL Final  . Sodium 04/22/2018 138  135 - 145 mEq/L Final  . Potassium 04/22/2018 3.5  3.5 - 5.1 mEq/L Final  . Chloride 04/22/2018 104  96 - 112 mEq/L Final  . CO2 04/22/2018 26  19 - 32 mEq/L Final  . Glucose, Bld 04/22/2018 71  70 - 99 mg/dL Final  . BUN 14/78/295611/09/2017 10  6 - 23 mg/dL Final  . Creatinine, Ser 04/22/2018 0.87  0.40 - 1.20 mg/dL Final  . Total Bilirubin 04/22/2018 0.3  0.2 - 1.2 mg/dL Final  . Alkaline Phosphatase 04/22/2018 55  39 - 117 U/L Final  . AST 04/22/2018 11  0 - 37 U/L Final  . ALT 04/22/2018 8  0 - 35 U/L Final  . Total Protein 04/22/2018 7.4  6.0 - 8.3 g/dL Final  . Albumin 21/30/865711/09/2017 4.2  3.5 - 5.2 g/dL Final  . Calcium 84/69/629511/09/2017 9.0  8.4 - 10.5 mg/dL Final  . GFR  28/41/324411/09/2017 92.43  >60.00 mL/min Final  . Cholesterol 04/22/2018 133  0 - 200 mg/dL Final  . Triglycerides 04/22/2018 77.0  0.0 - 149.0 mg/dL Final  . HDL 01/02/725311/09/2017 41.30  >39.00 mg/dL Final  . VLDL 66/44/034711/09/2017 15.4  0.0 - 40.0 mg/dL Final  . LDL Cholesterol 04/22/2018 76  0 - 99 mg/dL Final  . Total CHOL/HDL Ratio 04/22/2018 3   Final  . NonHDL 04/22/2018 91.82   Final  . HIV 1&2 Ab, 4th Generation 04/22/2018 NON-REACTIVE  NON-REACTI Final      Commons side effects, risks, benefits,  and alternatives for medications and treatment plan prescribed today were discussed, and the patient expressed understanding of the given instructions. Patient is instructed to call or message via MyChart if he/she has any questions or concerns regarding our treatment plan. No barriers to understanding were identified. We discussed Red Flag symptoms and signs in detail. Patient expressed understanding regarding what to do in case of urgent or emergency type symptoms.   Medication list was reconciled, printed and provided to the patient in AVS. Patient instructions and summary information was reviewed with the patient as documented in the AVS. This note was prepared with assistance of Dragon voice recognition software. Occasional wrong-word or sound-a-like substitutions may have occurred due to the inherent limitations of voice recognition software

## 2018-05-31 NOTE — Patient Instructions (Signed)
Please return in 8 weeks to recheck your anemia.  Take your iron twice daily as discussed. Eat iron rich foods.   We will get you set up for an ultrasound of your uterus and I have referred you to Dr. Crawford Givens to discuss ways to manage your heavy cycles, possible endometrial ablation.   Today you were given your Tdap vaccination.   If you have any questions or concerns, please don't hesitate to send me a message via MyChart or call the office at [phone]. Thank you for visiting with Erica Baxter today! It's our pleasure caring for you.   Iron-Rich Diet Iron is a mineral that helps your body to produce hemoglobin. Hemoglobin is a protein in your red blood cells that carries oxygen to your body's tissues. Eating too little iron may cause you to feel weak and tired, and it can increase your risk for infection. Eating enough iron is necessary for your body's metabolism, muscle function, and nervous system. Iron is naturally found in many foods. It can also be added to foods or fortified in foods. There are two types of dietary iron:  Heme iron. Heme iron is absorbed by the body more easily than nonheme iron. Heme iron is found in meat, poultry, and fish.  Nonheme iron. Nonheme iron is found in dietary supplements, iron-fortified grains, beans, and vegetables.  You may need to follow an iron-rich diet if:  You have been diagnosed with iron deficiency or iron-deficiency anemia.  You have a condition that prevents you from absorbing dietary iron, such as: ? Infection in your intestines. ? Celiac disease. This involves long-lasting (chronic) inflammation of your intestines.  You do not eat enough iron.  You eat a diet that is high in foods that impair iron absorption.  You have lost a lot of blood.  You have heavy bleeding during your menstrual cycle.  You are pregnant.  What is my plan? Your health care provider may help you to determine how much iron you need per day based on your  condition. Generally, when a person consumes sufficient amounts of iron in the diet, the following iron needs are met:  Men. ? 28-71 years old: 11 mg per day. ? 70-84 years old: 8 mg per day.  Women. ? 2-94 years old: 15 mg per day. ? 48-40 years old: 18 mg per day. ? Over 28 years old: 8 mg per day. ? Pregnant women: 27 mg per day. ? Breastfeeding women: 9 mg per day.  What do I need to know about an iron-rich diet?  Eat fresh fruits and vegetables that are high in vitamin C along with foods that are high in iron. This will help increase the amount of iron that your body absorbs from food, especially with foods containing nonheme iron. Foods that are high in vitamin C include oranges, peppers, tomatoes, and mango.  Take iron supplements only as directed by your health care provider. Overdose of iron can be life-threatening. If you were prescribed iron supplements, take them with orange juice or a vitamin C supplement.  Cook foods in pots and pans that are made from iron.  Eat nonheme iron-containing foods alongside foods that are high in heme iron. This helps to improve your iron absorption.  Certain foods and drinks contain compounds that impair iron absorption. Avoid eating these foods in the same meal as iron-rich foods or with iron supplements. These include: ? Coffee, black tea, and red wine. ? Milk, dairy products, and foods that are  high in calcium. ? Beans, soybeans, and peas. ? Whole grains.  When eating foods that contain both nonheme iron and compounds that impair iron absorption, follow these tips to absorb iron better. ? Soak beans overnight before cooking. ? Soak whole grains overnight and drain them before using. ? Ferment flours before baking, such as using yeast in bread dough. What foods can I eat? Grains Iron-fortified breakfast cereal. Iron-fortified whole-wheat bread. Enriched rice. Sprouted grains. Vegetables Spinach. Potatoes with skin. Green peas.  Broccoli. Red and green bell peppers. Fermented vegetables. Fruits Prunes. Raisins. Oranges. Strawberries. Mango. Grapefruit. Meats and Other Protein Sources Beef liver. Oysters. Beef. Shrimp. [place]. Chicken. [place]. Sardines. Chickpeas. Nuts. Tofu. Beverages Tomato juice. Fresh orange juice. Prune juice. Hibiscus tea. Fortified instant breakfast shakes. Condiments Tahini. Fermented soy sauce. Sweets and Desserts Black-strap molasses. Other Wheat germ. The items listed above may not be a complete list of recommended foods or beverages. Contact your dietitian for more options. What foods are not recommended? Grains Whole grains. Bran cereal. Bran flour. Oats. Vegetables Artichokes. Brussels sprouts. Kale. Fruits Blueberries. Raspberries. Strawberries. Figs. Meats and Other Protein Sources Soybeans. Products made from soy protein. Dairy Milk. Cream. Cheese. Yogurt. Cottage cheese. Beverages Coffee. Black tea. Red wine. Sweets and Desserts Cocoa. Chocolate. Ice cream. Other Basil. Oregano. Parsley. The items listed above may not be a complete list of foods and beverages to avoid. Contact your dietitian for more information. This information is not intended to replace advice given to you by your health care provider. Make sure you discuss any questions you have with your health care provider. Document Released: 01/17/2005 Document Revised: 12/24/2015 Document Reviewed: 12/31/2013 Elsevier Interactive Patient Education  2018 [employer].  Iron Deficiency Anemia, Adult Iron-deficiency anemia is when you have a low amount of red blood cells or hemoglobin. This happens because you have too little iron in your body. Hemoglobin carries oxygen to parts of the body. Anemia can cause your body to not get enough oxygen. It may or may not cause symptoms. Follow these instructions at home: Medicines  Take over-the-counter and prescription medicines only as told by your doctor. This includes  iron pills (supplements) and vitamins.  If you cannot handle taking iron pills by mouth, ask your doctor about getting iron through: ? A vein (intravenously). ? A shot (injection) into a muscle.  Take iron pills when your stomach is empty. If you cannot handle this, take them with food.  Do not drink milk or take antacids at the same time as your iron pills.  To prevent trouble pooping (constipation), eat fiber or take medicine (stool softener) as told by your doctor. Eating and drinking  Talk with your doctor before changing the foods you eat. He or she may tell you to eat foods that have a lot of iron, such as: ? Liver. ? Lowfat (lean) beef. ? Breads and cereals that have iron added to them (fortified breads and cereals). ? Eggs. ? Dried fruit. ? Dark green, leafy vegetables.  Drink enough fluid to keep your pee (urine) clear or pale yellow.  Eat fresh fruits and vegetables that are high in vitamin C. They help your body to use iron. Foods with a lot of vitamin C include: ? Oranges. ? Peppers. ? Tomatoes. ? Mangoes. General instructions  Return to your normal activities as told by your doctor. Ask your doctor what activities are safe for you.  Keep yourself clean, and keep things clean around you (your surroundings). Anemia can make you  get sick more easily.  Keep all follow-up visits as told by your doctor. This is important. Contact a doctor if:  You feel sick to your stomach (nauseous).  You throw up (vomit).  You feel weak.  You are sweating for no clear reason.  You have trouble pooping, such as: ? Pooping (having a bowel movement) less than 3 times a week. ? Straining to poop. ? Having poop that is hard, dry, or larger than normal. ? Feeling full or bloated. ? Pain in the lower belly. ? Not feeling better after pooping. Get help right away if:  You pass out (faint). If this happens, do not drive yourself to the hospital. Call your local emergency services  (911 in the [place]  You have chest pain.  You have shortness of breath that: ? Is very bad. ? Gets worse with physical activity.  You have a fast heartbeat.  You get light-headed when getting up from sitting or lying down. This information is not intended to replace advice given to you by your health care provider. Make sure you discuss any questions you have with your health care provider. Document Released: 07/08/2010 Document Revised: 02/23/2016 Document Reviewed: 02/23/2016 Elsevier Interactive Patient Education  2017 [employer].

## 2018-06-01 LAB — ANEMIA PROFILE B
%SAT: 24 % (ref 16–45)
ABS Retic: 51480 cells/uL (ref 20000–8000)
BASOS ABS: 59 {cells}/uL (ref 0–200)
Basophils Relative: 1.4 %
EOS PCT: 2.6 %
Eosinophils Absolute: 109 cells/uL (ref 15–500)
Ferritin: 6 ng/mL — ABNORMAL LOW (ref 16–154)
Folate: 3.6 ng/mL — ABNORMAL LOW
HEMATOCRIT: 29.4 % — AB (ref 35.0–45.0)
Hemoglobin: 8.4 g/dL — ABNORMAL LOW (ref 11.7–15.5)
Iron: 103 ug/dL (ref 40–190)
Lymphs Abs: 1743 cells/uL (ref 850–3900)
MCH: 19.6 pg — AB (ref 27.0–33.0)
MCHC: 28.6 g/dL — AB (ref 32.0–36.0)
MCV: 68.5 fL — AB (ref 80.0–100.0)
MPV: 10 fL (ref 7.5–12.5)
Monocytes Relative: 6.6 %
NEUTROS PCT: 47.9 %
Neutro Abs: 2012 cells/uL (ref 1500–7800)
Platelets: 350 10*3/uL (ref 140–400)
RBC: 4.29 10*6/uL (ref 3.80–5.10)
RDW: 17.2 % — ABNORMAL HIGH (ref 11.0–15.0)
RETIC CT PCT: 1.2 %
TIBC: 424 mcg/dL (calc) (ref 250–450)
TOTAL LYMPHOCYTE: 41.5 %
VITAMIN B 12: 556 pg/mL (ref 200–1100)
WBC: 4.2 10*3/uL (ref 3.8–10.8)
WBCMIX: 277 {cells}/uL (ref 200–950)

## 2018-06-03 ENCOUNTER — Encounter: Payer: Self-pay | Admitting: Family Medicine

## 2018-06-03 DIAGNOSIS — E538 Deficiency of other specified B group vitamins: Secondary | ICD-10-CM

## 2018-06-03 DIAGNOSIS — D500 Iron deficiency anemia secondary to blood loss (chronic): Secondary | ICD-10-CM | POA: Insufficient documentation

## 2018-06-03 HISTORY — DX: Deficiency of other specified B group vitamins: E53.8

## 2018-06-03 NOTE — Progress Notes (Signed)
Please call patient: I have reviewed his/her lab results. Iron remains low: to take niferex iron capsules twice a day and we will recheck in 8 weeks. Also with low folate levels. Please take folate OTC 800 - daily.

## 2018-06-06 ENCOUNTER — Ambulatory Visit
Admission: RE | Admit: 2018-06-06 | Discharge: 2018-06-06 | Disposition: A | Discharge status: Home / Self Care | Payer: BC Managed Care – PPO | Source: Ambulatory Visit | Attending: Family Medicine | Admitting: Family Medicine

## 2018-06-06 DIAGNOSIS — N920 Excessive and frequent menstruation with regular cycle: Secondary | ICD-10-CM

## 2018-06-06 DIAGNOSIS — D500 Iron deficiency anemia secondary to blood loss (chronic): Secondary | ICD-10-CM

## 2018-06-07 NOTE — Progress Notes (Signed)
Please call patient: I have reviewed his/her results. Ultrasound looks ok, no definitive fibroids or mass. The GYN will have it for review. Continue iron therapy. thanks

## 2018-06-19 HISTORY — PX: ENDOMETRIAL ABLATION: SHX621

## 2018-08-08 ENCOUNTER — Ambulatory Visit: Payer: BC Managed Care – PPO | Admitting: Family Medicine

## 2018-08-15 ENCOUNTER — Ambulatory Visit: Payer: BC Managed Care – PPO | Admitting: Family Medicine

## 2018-08-19 ENCOUNTER — Ambulatory Visit: Payer: BC Managed Care – PPO | Admitting: Family Medicine

## 2018-08-19 ENCOUNTER — Other Ambulatory Visit: Payer: Self-pay

## 2018-08-19 ENCOUNTER — Encounter: Payer: Self-pay | Admitting: Family Medicine

## 2018-08-19 VITALS — BP 126/84 | HR 97 | Temp 98.1°F | Resp 16 | Ht 64.0 in | Wt 280.2 lb

## 2018-08-19 DIAGNOSIS — E538 Deficiency of other specified B group vitamins: Secondary | ICD-10-CM | POA: Diagnosis not present

## 2018-08-19 DIAGNOSIS — D500 Iron deficiency anemia secondary to blood loss (chronic): Secondary | ICD-10-CM | POA: Diagnosis not present

## 2018-08-19 NOTE — Patient Instructions (Signed)
Please follow up if symptoms do not improve or as needed.   Good luck on your ablation. That will help!  I will release your lab results to you on your MyChart account with further instructions. Please reply with any questions.

## 2018-08-19 NOTE — Progress Notes (Signed)
Subjective  CC:  Chief Complaint  Patient presents with  . Anemia    HPI: Erica Baxter is a 43 y.o. female who presents to the office today to address the problems listed above in the chief complaint.  Erica Baxter returns for follow-up on her iron deficiency anemia due to chronic blood loss and mild folate deficiency.  Since her last visit in December she has done much better taking her Niferex pills twice a day.  She tries to get the folate pill in it as well.  She is seeing gynecology who will perform an endometrial ablation next week.  She feels well.  She was asymptomatic from an anemic standpoint.  No chest pain, palpitations, shortness of breath or lower extremity edema.  Tolerating the iron well  Lab Results  Component Value Date   WBC 4.2 05/31/2018   HGB 8.4 (L) 05/31/2018   HCT 29.4 (L) 05/31/2018   MCV 68.5 (L) 05/31/2018   PLT 350 05/31/2018   Lab Results  Component Value Date   IRON 103 05/31/2018   TIBC 424 05/31/2018   FERRITIN 6 (L) 05/31/2018    Assessment  1. Iron deficiency anemia due to chronic blood loss   2. Folate deficiency      Plan   Iron deficiency anemia with component of folate deficiency: Has completed 8 weeks of supplementation.  Recheck level today and will address further supplement needs from there.  Agree with endometrial ablation to correct menorrhagia.  Follow up: PRN Visit date not found  Orders Placed This Encounter  Procedures  . CBC with Differential/Platelet  . Iron, TIBC and Ferritin Panel  . Folate RBC   No orders of the defined types were placed in this encounter.     I reviewed the patients updated PMH, FH, and SocHx.    Patient Active Problem List   Diagnosis Date Noted  . Iron deficiency anemia due to chronic blood loss 06/03/2018  . Folate deficiency 06/03/2018  . Annual physical exam 04/22/2018  . PCOS (polycystic ovarian syndrome) 04/22/2018  . Pes planus 04/22/2018   Current Meds  Medication Sig  . iron  polysaccharides (NIFEREX) 150 MG capsule Take 1 capsule (150 mg total) by mouth 2 (two) times daily.    Allergies: Patient is allergic to pineapple and shellfish allergy. Family History: Patient family history includes Asthma in her daughter and mother; COPD in her mother; Depression in her brother and mother; Diabetes in her mother and paternal grandmother; Early death in her mother; Heart attack in her son; Heart disease in her paternal grandfather; Prostate cancer in her maternal grandfather. Social History:  Patient  reports that she has never smoked. She has never used smokeless tobacco. She reports that she does not drink alcohol or use drugs.  Review of Systems: Constitutional: Negative for fever malaise or anorexia Cardiovascular: negative for chest pain Respiratory: negative for SOB or persistent cough Gastrointestinal: negative for abdominal pain  Objective  Vitals: BP 126/84   Pulse 97   Temp 98.1 F (36.7 C) (Oral)   Resp 16   Ht 5\' 4"  (1.626 m)   Wt 280 lb 3.2 oz (127.1 kg)   LMP 08/05/2018   SpO2 97%   BMI 48.10 kg/m  General: no acute distress , A&Ox3 HEENT: PEERL, conjunctiva normal, Oropharynx moist,neck is supple Cardiovascular:  RRR without murmur or gallop.  Respiratory:  Good breath sounds bilaterally, CTAB with normal respiratory effort Skin:  Warm, no rashes     Commons side  effects, risks, benefits, and alternatives for medications and treatment plan prescribed today were discussed, and the patient expressed understanding of the given instructions. Patient is instructed to call or message via MyChart if he/she has any questions or concerns regarding our treatment plan. No barriers to understanding were identified. We discussed Red Flag symptoms and signs in detail. Patient expressed understanding regarding what to do in case of urgent or emergency type symptoms.   Medication list was reconciled, printed and provided to the patient in AVS. Patient  instructions and summary information was reviewed with the patient as documented in the AVS. This note was prepared with assistance of Dragon voice recognition software. Occasional wrong-word or sound-a-like substitutions may have occurred due to the inherent limitations of voice recognition software

## 2018-08-20 LAB — CBC WITH DIFFERENTIAL/PLATELET
BASOS ABS: 0.1 10*3/uL (ref 0.0–0.1)
Basophils Relative: 1.1 % (ref 0.0–3.0)
Eosinophils Absolute: 0.1 10*3/uL (ref 0.0–0.7)
Eosinophils Relative: 1.5 % (ref 0.0–5.0)
HEMATOCRIT: 29.5 % — AB (ref 36.0–46.0)
HEMOGLOBIN: 9.3 g/dL — AB (ref 12.0–15.0)
LYMPHS PCT: 32 % (ref 12.0–46.0)
Lymphs Abs: 1.6 10*3/uL (ref 0.7–4.0)
MCHC: 31.6 g/dL (ref 30.0–36.0)
MCV: 67.6 fl — ABNORMAL LOW (ref 78.0–100.0)
MONOS PCT: 5.6 % (ref 3.0–12.0)
Monocytes Absolute: 0.3 10*3/uL (ref 0.1–1.0)
Neutro Abs: 3 10*3/uL (ref 1.4–7.7)
Neutrophils Relative %: 59.8 % (ref 43.0–77.0)
PLATELETS: 324 10*3/uL (ref 150.0–400.0)
RBC: 4.37 Mil/uL (ref 3.87–5.11)
RDW: 17.9 % — ABNORMAL HIGH (ref 11.5–15.5)
WBC: 5 10*3/uL (ref 4.0–10.5)

## 2018-08-20 LAB — IRON,TIBC AND FERRITIN PANEL
%SAT: 4 % — AB (ref 16–45)
FERRITIN: 5 ng/mL — AB (ref 16–154)
Iron: 17 ug/dL — ABNORMAL LOW (ref 40–190)
TIBC: 392 ug/dL (ref 250–450)

## 2018-08-20 LAB — FOLATE RBC: RBC Folate: 745 ng/mL RBC (ref >280)

## 2019-02-22 IMAGING — MG DIGITAL SCREENING BILATERAL MAMMOGRAM WITH TOMO AND CAD
6 of 10 series · 6 of 30 positions shown · non-contrast
Comparison: None.

CLINICAL DATA: Screening.

EXAM:
DIGITAL SCREENING BILATERAL MAMMOGRAM WITH TOMO AND CAD

[L MLO synth-2D]
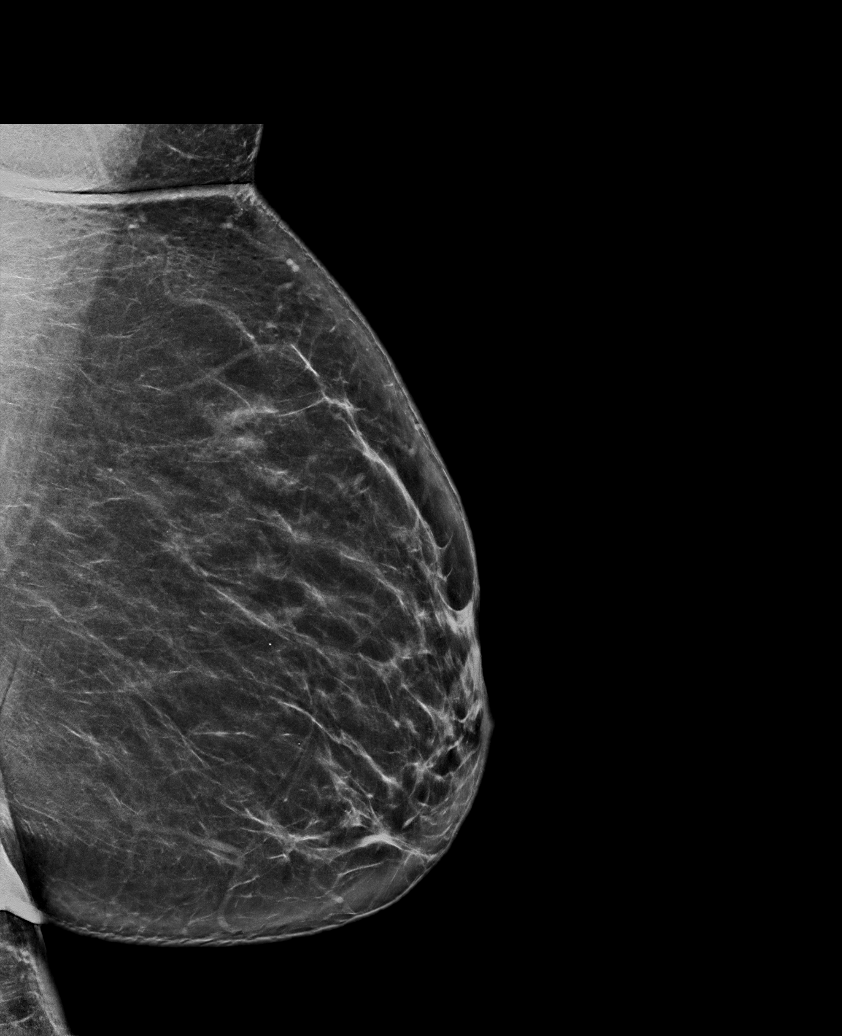

[L CC synth-2D]
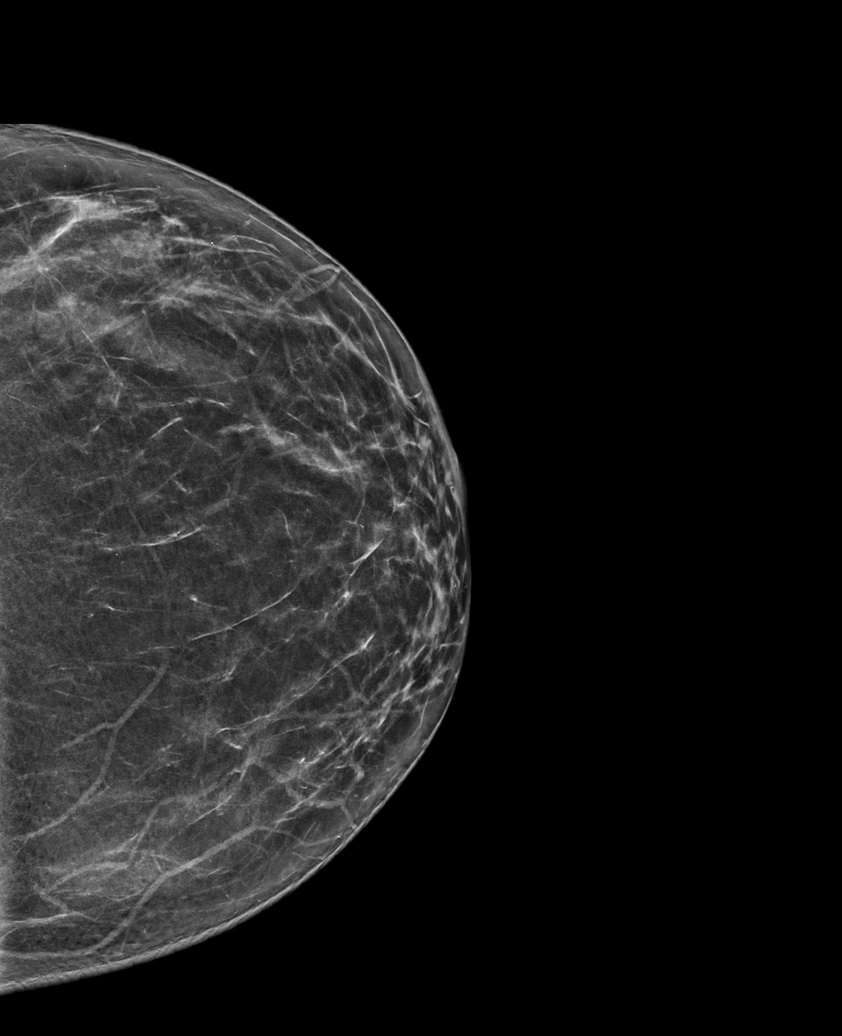

[R MLO synth-2D (1 of 2)]
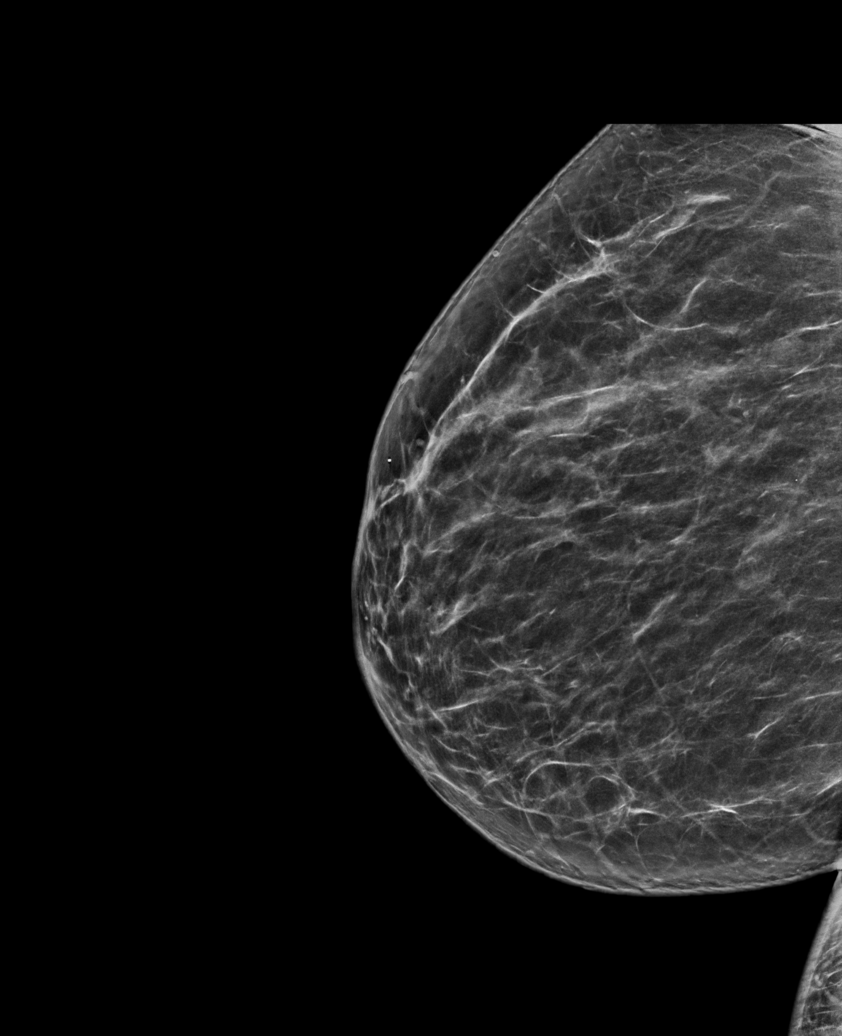

[R CC synth-2D]
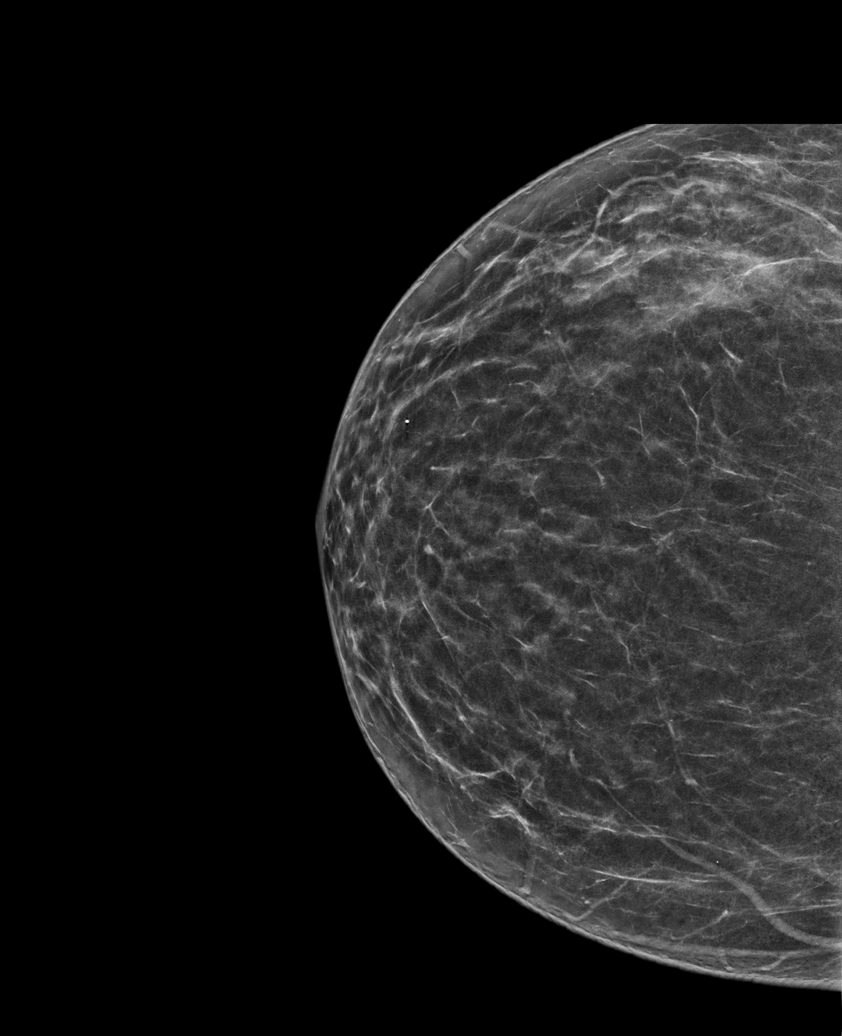

[R MLO synth-2D (2 of 2)]
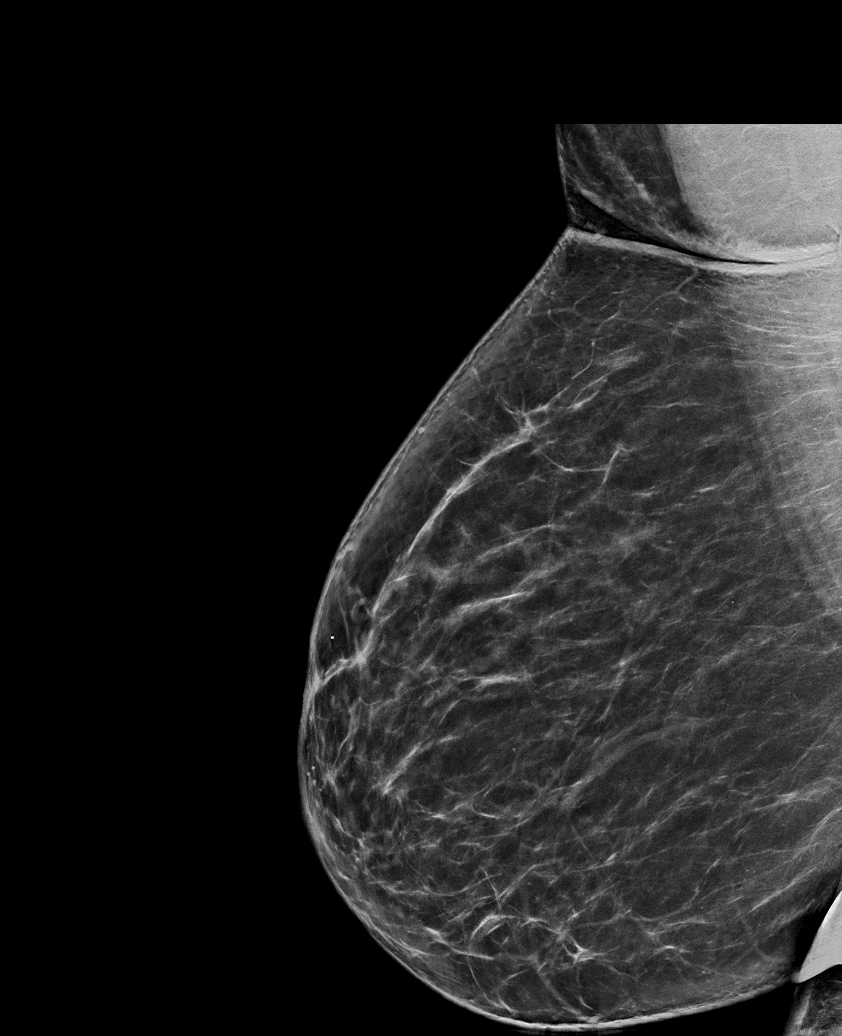

[R MLO tomo · tomo slice 38/75.0]
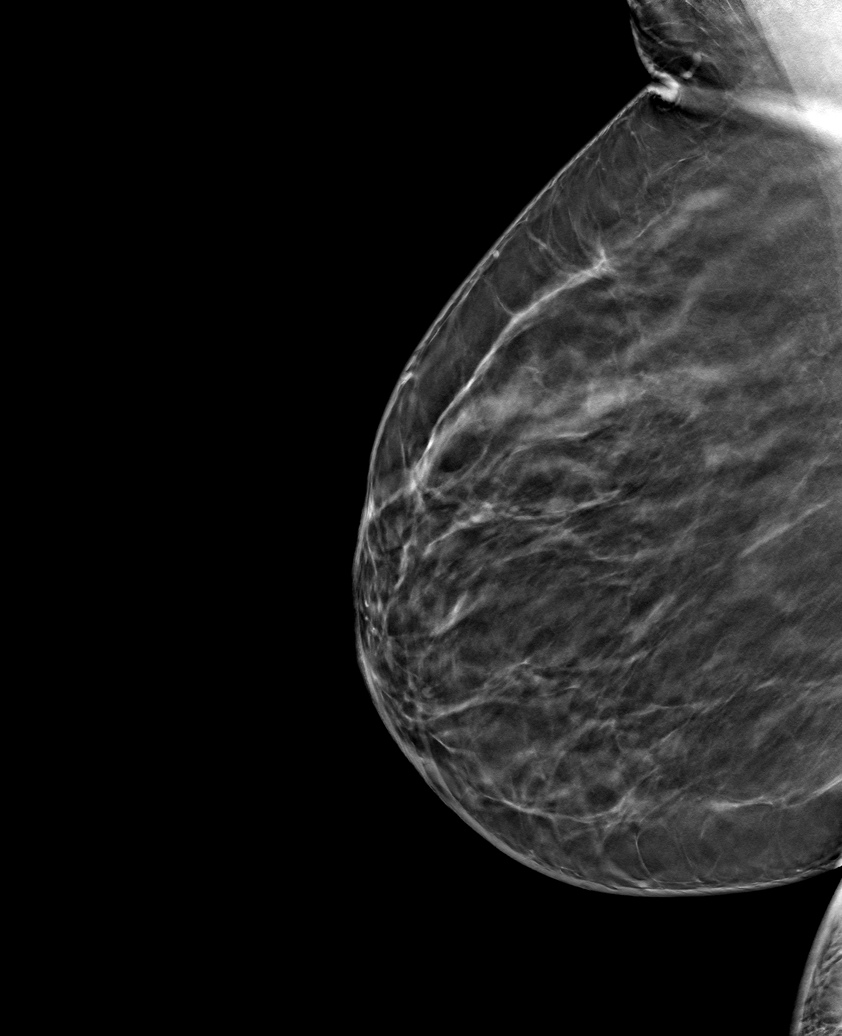

[6 of 30 positions shown; findings below may reference images not displayed]

ACR Breast Density Category b: There are scattered areas of
fibroglandular density.
FINDINGS: There are no findings suspicious for malignancy. Images were
processed with CAD.
IMPRESSION: No mammographic evidence of malignancy. A result letter of this
screening mammogram will be mailed directly to the patient.

RECOMMENDATION:
Screening mammogram in one year. (Code:Y5-G-EJ6)

BI-RADS CATEGORY  1: Negative.

## 2019-06-27 ENCOUNTER — Ambulatory Visit: Payer: BC Managed Care – PPO | Attending: Internal Medicine

## 2019-06-27 DIAGNOSIS — Z20822 Contact with and (suspected) exposure to covid-19: Secondary | ICD-10-CM

## 2019-06-29 LAB — NOVEL CORONAVIRUS, NAA: SARS-CoV-2, NAA: DETECTED — AB

## 2019-09-14 IMAGING — US US PELVIS COMPLETE TRANSABD/TRANSVAG
1 series · 14 of 25 positions shown · non-contrast
Comparison: Pelvic ultrasound 07/05/2011, CT 07/05/2011

CLINICAL DATA: Heavy periods, anemia

EXAM:
TRANSABDOMINAL AND TRANSVAGINAL ULTRASOUND OF PELVIS
TECHNIQUE: Both transabdominal and transvaginal ultrasound examinations of the
pelvis were performed. Transabdominal technique was performed for
global imaging of the pelvis including uterus, ovaries, adnexal
regions, and pelvic cul-de-sac. It was necessary to proceed with
endovaginal exam following the transabdominal exam to visualize the
uterus endometrium ovaries.

[Series 1: us pelvis complete transabd/transvag · 0.23mm/px · 14 of 71 slices shown]
[im 1/71]
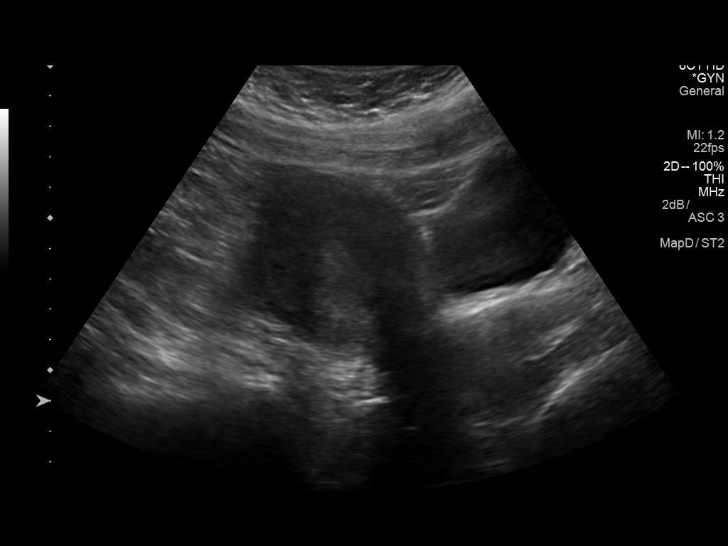
[im 6/71]
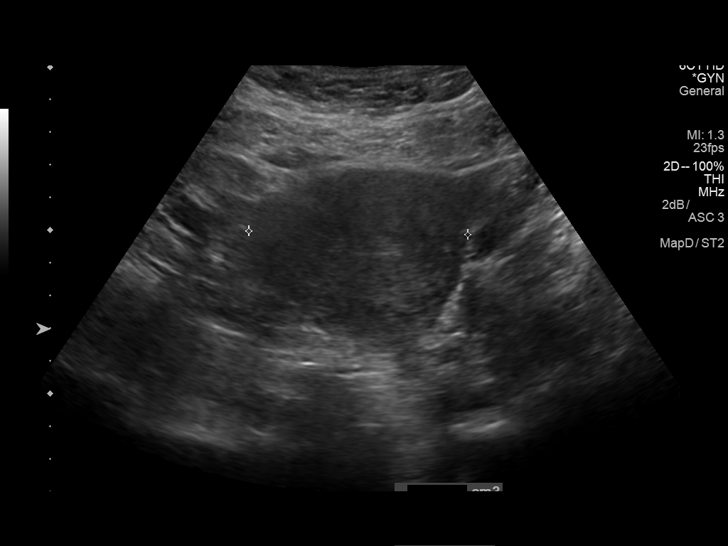
[im 12/71]
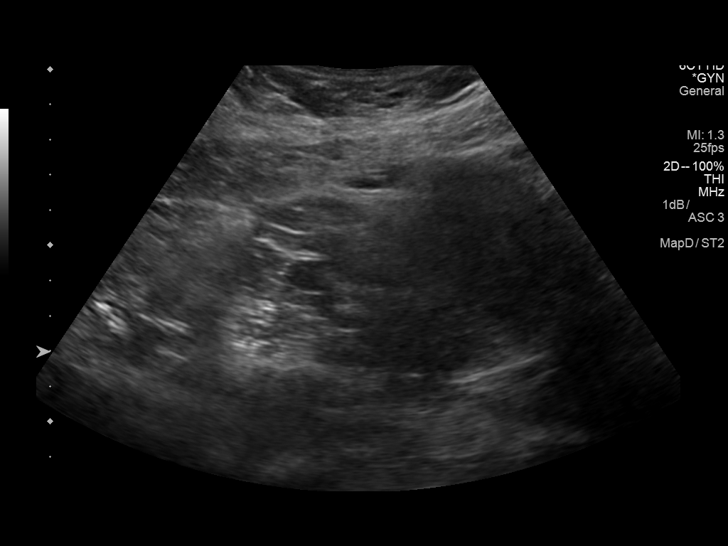
[im 18/71]
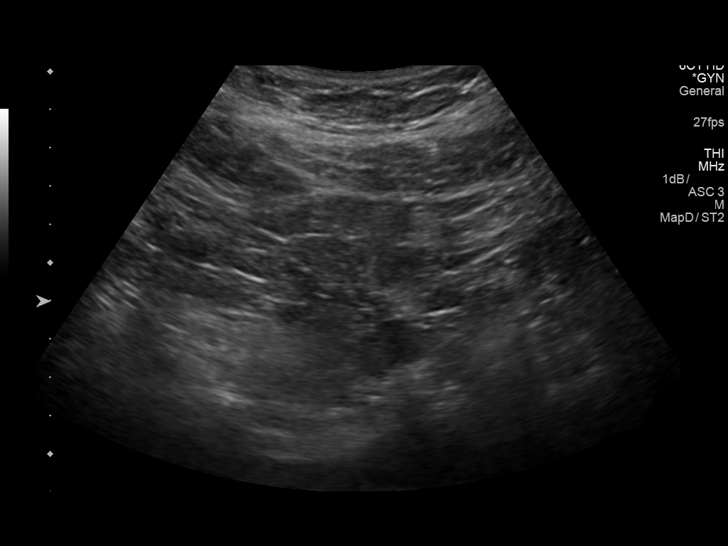
[im 24/71]
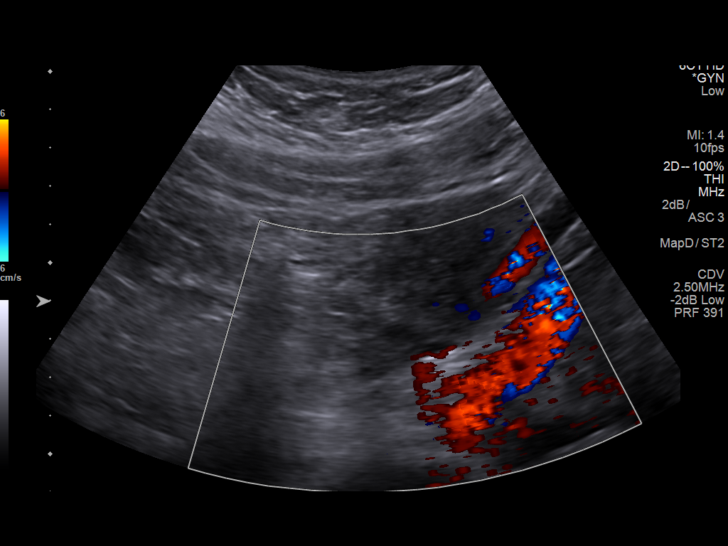
[im 27/71]
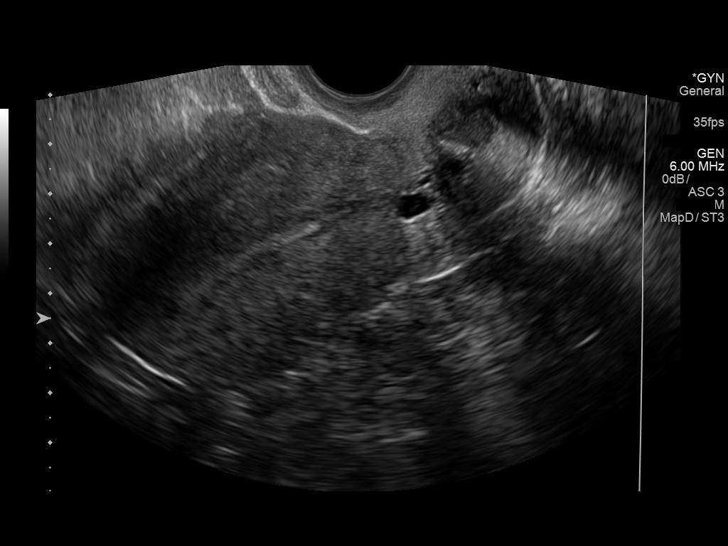
[im 33/71]
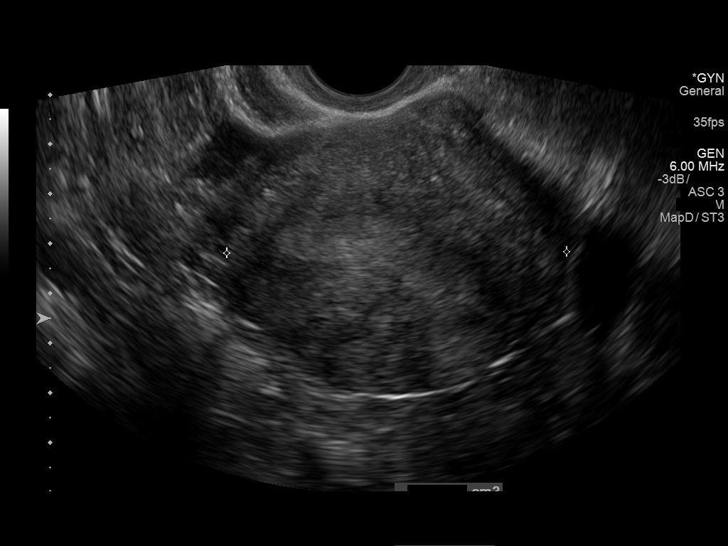
[im 38/71]
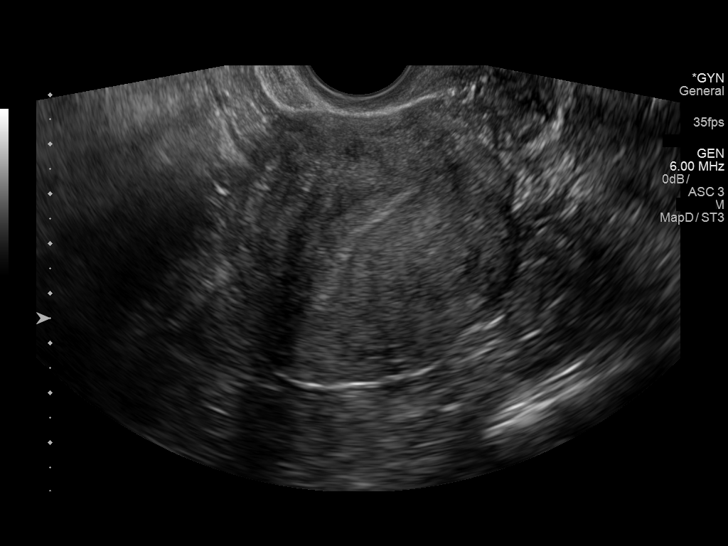
[im 44/71]
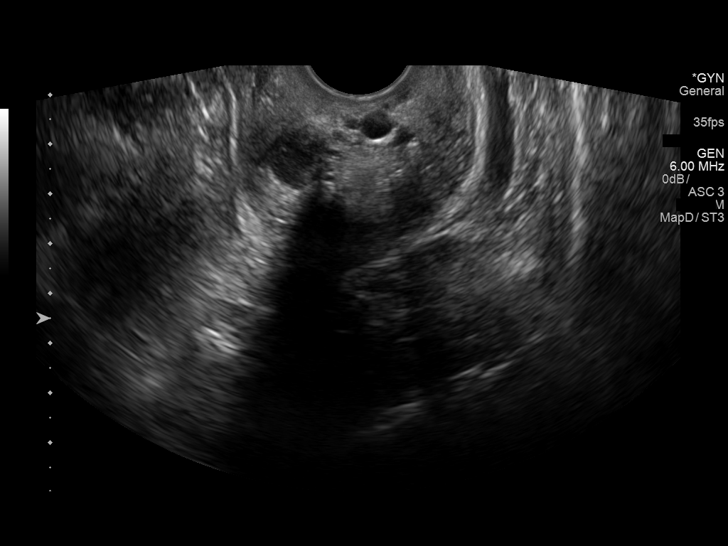
[im 47/71]
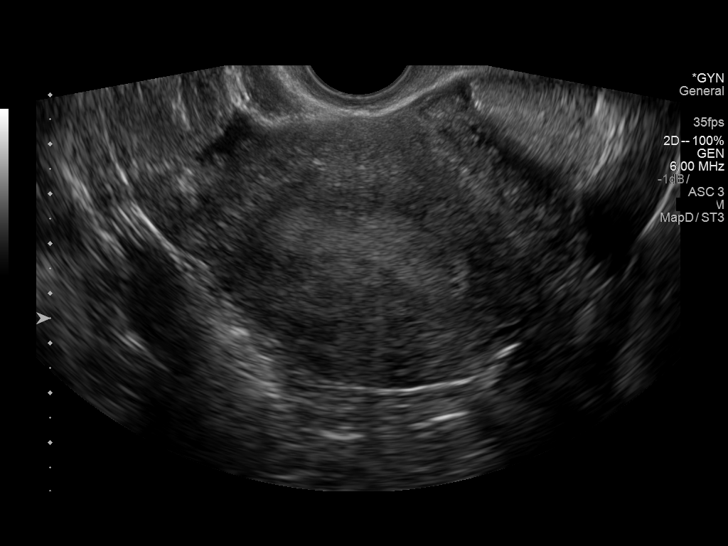
[im 53/71]
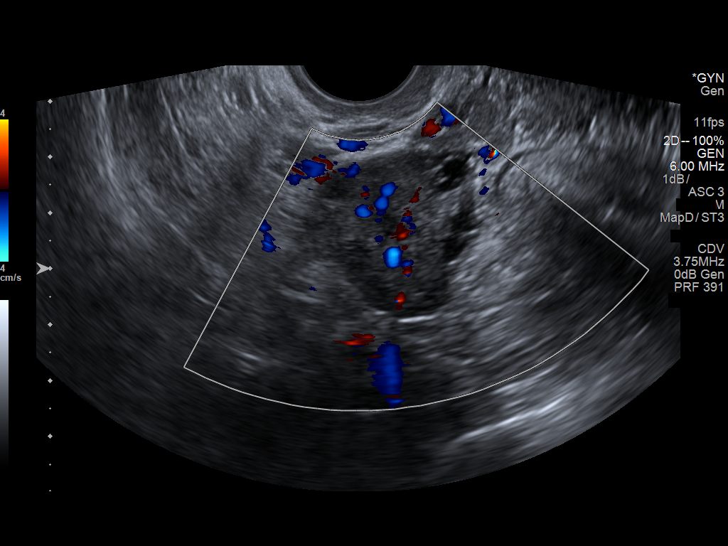
[im 59/71]
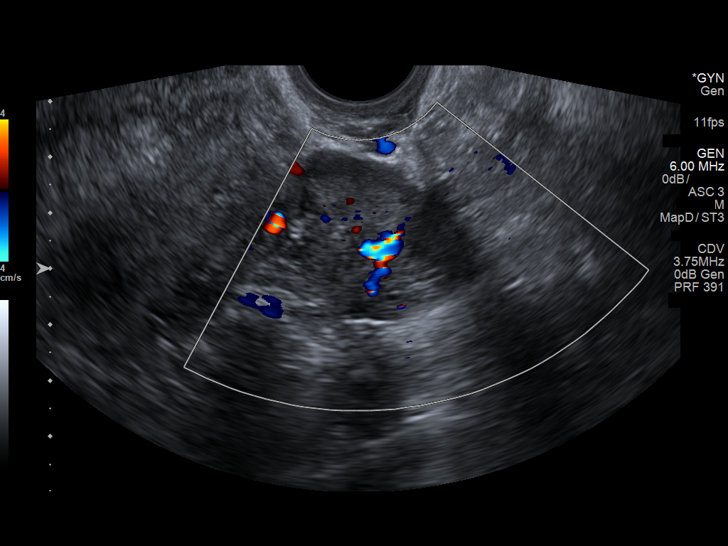
[im 65/71]
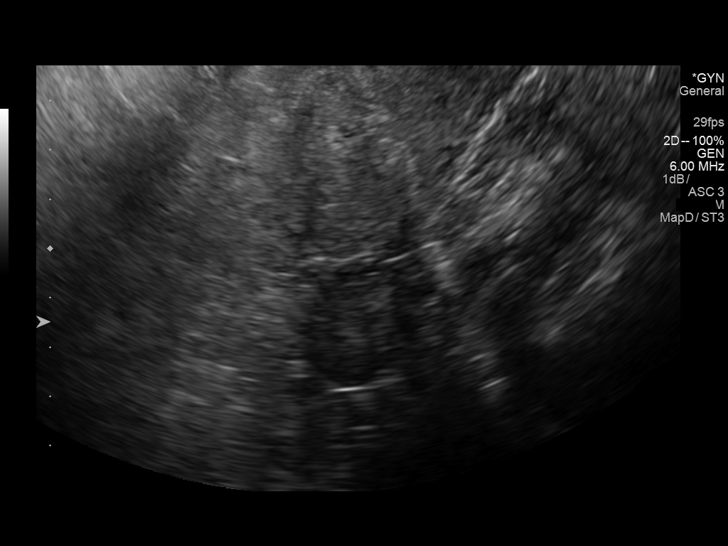
[im 71/71]
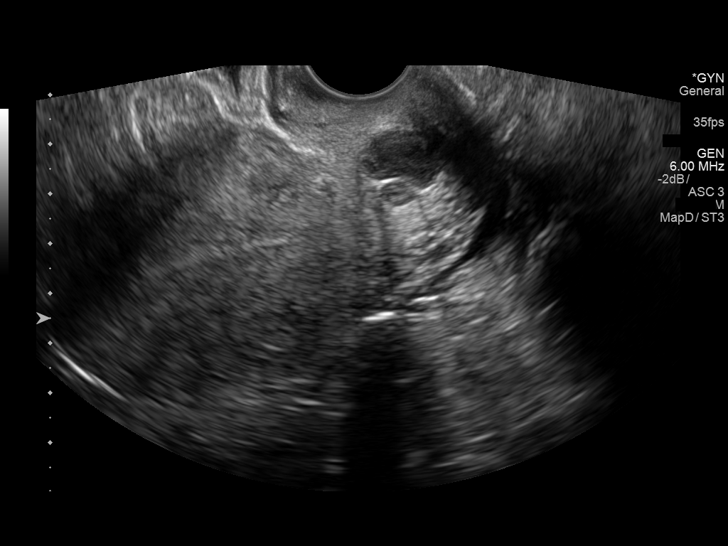

[14 of 25 positions shown; findings below may reference images not displayed]

FINDINGS: Uterus

Measurements: 11.1 cm length by 5.3 cm height by 6.7 cm wide =
volume: 205 mL. Heterogeneous echotexture without definitive mass.

Endometrium

Thickness: 7.6 mm.  No focal abnormality visualized.

Right ovary

Measurements: 3.5 x 2.4 x 2.9 cm = volume: 12.2 mL. Normal
appearance/no adnexal mass.

Left ovary

Measurements: 2.8 x 2.1 x 2 cm = volume: 6 mL. Normal appearance/no
adnexal mass.

Other findings

Trace free fluid
IMPRESSION: 1. Heterogeneous myometrial echotexture but without discrete mass
lesion/focal fibroid
2. Trace free fluid.  Otherwise negative pelvic ultrasound

## 2021-12-18 ENCOUNTER — Encounter: Payer: Self-pay | Admitting: Family Medicine

## 2021-12-27 ENCOUNTER — Encounter: Payer: Self-pay | Admitting: Family Medicine

## 2021-12-27 ENCOUNTER — Ambulatory Visit (INDEPENDENT_AMBULATORY_CARE_PROVIDER_SITE_OTHER): Payer: BC Managed Care – PPO | Admitting: Family Medicine

## 2021-12-27 DIAGNOSIS — Z862 Personal history of diseases of the blood and blood-forming organs and certain disorders involving the immune mechanism: Secondary | ICD-10-CM

## 2021-12-27 DIAGNOSIS — R030 Elevated blood-pressure reading, without diagnosis of hypertension: Secondary | ICD-10-CM

## 2021-12-27 DIAGNOSIS — Z029 Encounter for administrative examinations, unspecified: Secondary | ICD-10-CM | POA: Diagnosis not present

## 2021-12-27 DIAGNOSIS — Z1231 Encounter for screening mammogram for malignant neoplasm of breast: Secondary | ICD-10-CM

## 2021-12-27 DIAGNOSIS — E282 Polycystic ovarian syndrome: Secondary | ICD-10-CM

## 2021-12-27 MED ORDER — PHENTERMINE HCL 37.5 MG PO TABS: 37.5000 mg | ORAL_TABLET | Freq: Every day | ORAL | 2 refills | Status: DC

## 2021-12-27 NOTE — Patient Instructions (Signed)
Please return in 3 months for your annual complete physical; please come fasting.   Please contact your insurance company to see if they cover any GLP-1 agonists for weight loss: mounjaro, wegovy, saxenda, ozempic and trulicity are all examples.   In the meantime, let's see if phentermine is beneficial.   If you have any questions or concerns, please don't hesitate to send me a message via MyChart or call the office at [phone]. Thank you for visiting with [place] today! It's our pleasure caring for you.   I have ordered a mammogram and/or bone density for you as we discussed today: [x]   Mammogram  []   Bone Density  Please call the office checked below to schedule your appointment:  [x]   [facility]      [address] [place],        [address]         []   [facility]  [address] [place],  [facility]

## 2021-12-27 NOTE — Progress Notes (Signed)
Subjective  CC:  Chief Complaint  Patient presents with   Employment Physical    Pt has a form that needs to be completed for work.    HPI: Erica Baxter is a 46 y.o. female who presents to the office today to address the problems listed above in the chief complaint. 46 year old female last here over 3 years ago is in need of a administrative employment physical.  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  She has history of iron deficiency anemia and morbid obesity but no other chronic medical conditions.  She has no history of mood disorder.  She has no physical disabilities.  She is well suited to work with children.  Form completed. Morbid obesity: She struggles to lose weight.  Tries eat a healthy diet, about 1800 kcal/day.  Her weight is up 27 pounds from her last visit 2 years ago.  She has been to [facility] in the past and he has used hormonal treatment and beta hCG without improvement in weight loss.  Was also on testosterone supplements.  She would like further help in managing her weight.  She has never been on phentermine.  She does not have diabetes to her knowledge.  No symptoms of hyperglycemia. Prehypertension: No history of hypertension.  No chest pain or shortness of breath. PCOS and menorrhagia: Had significant iron deficiency anemia at her last visit.  Since, had endometrial ablation by GYN.  Still with regular periods but less heavy.  Denies symptoms of anemia.  Has not had blood work in several years. Health maintenance: Overdue for physical exam and lab work.  Due for mammogram.  Pap smear is current.  BP Readings from Last 3 Encounters:  12/27/21 138/80  08/19/18 126/84  05/31/18 124/82    Wt Readings from Last 3 Encounters:  12/27/21 (!) 307 lb (139.3 kg)  08/19/18 280 lb 3.2 oz (127.1 kg)  05/31/18 273 lb 12.8 oz (124.2 kg)    Assessment  1. Morbid obesity (HCC)   2. Administrative encounter   3. Prehypertension   4. History of  iron deficiency anemia   5. Encounter for screening mammogram for breast cancer   6. PCOS (polycystic ovarian syndrome)      Plan  Morbid obesity:: Counseling and education given.  Discussed diet, exercise changes, decreasing calories, trial of phentermine.  She would also be a good GLP-1 candidate if this is an option for her.  She will research with her pharmacy benefits.  We will check blood work at her upcoming physical to rule out thyroid disease and diabetes. Prehypertension: We will need to monitor blood pressure on phentermine.  Low-salt diet recommended History of iron deficiency anemia due to PCOS related menorrhagia: Now resolved clinically.  We will check blood counts when she returns at her physical.  She is not on an iron supplement. Preemployment physical: She is cleared to work with children.  Form completed. Health maintenance: Patient to schedule her mammogram.  She will schedule a complete physical with me as well  Follow up: 3 months for complete physical, sooner for obesity related concerns Visit date not found  Orders Placed This Encounter  Procedures   MM DIGITAL SCREENING BILATERAL   Meds ordered this encounter  Medications   phentermine (ADIPEX-P) 37.5 MG tablet    Sig: Take 1 tablet (37.5 mg total) by mouth daily before breakfast.    Dispense:  30 tablet    Refill:  2  I reviewed the patients updated PMH, FH, and SocHx.    Patient Active Problem List   Diagnosis Date Noted   Iron deficiency anemia due to chronic blood loss 06/03/2018   Folate deficiency 06/03/2018   Annual physical exam 04/22/2018   PCOS (polycystic ovarian syndrome) 04/22/2018   Pes planus 04/22/2018   Current Meds  Medication Sig   meloxicam (MOBIC) 15 MG tablet Take 15 mg by mouth daily.   phentermine (ADIPEX-P) 37.5 MG tablet Take 1 tablet (37.5 mg total) by mouth daily before breakfast.    Allergies: Patient is allergic to pineapple and shellfish allergy. Family  History: Patient family history includes Asthma in her daughter and mother; COPD in her mother; Depression in her brother and mother; Diabetes in her mother and paternal grandmother; Early death in her mother; Heart attack in her son; Heart disease in her paternal grandfather; Prostate cancer in her maternal grandfather. Social History:  Patient  reports that she has never smoked. She has never used smokeless tobacco. She reports that she does not drink alcohol and does not use drugs.  Review of Systems: Constitutional: Negative for fever malaise or anorexia Cardiovascular: negative for chest pain Respiratory: negative for SOB or persistent cough Gastrointestinal: negative for abdominal pain  Objective  Vitals: BP 138/80   Pulse 65   Temp 98 F (36.7 C)   Ht 5\' 4"  (1.626 m)   Wt (!) 307 lb (139.3 kg)   SpO2 99%   BMI 52.70 kg/m  General: no acute distress , A&Ox3, morbidly obese Mood: Bright HEENT: PEERL, conjunctiva normal, neck is supple Cardiovascular:  RRR without murmur or gallop.  Respiratory:  Good breath sounds bilaterally, CTAB with normal respiratory effort Skin:  Warm, no rashes No edema    Commons side effects, risks, benefits, and alternatives for medications and treatment plan prescribed today were discussed, and the patient expressed understanding of the given instructions. Patient is instructed to call or message via MyChart if he/she has any questions or concerns regarding our treatment plan. No barriers to understanding were identified. We discussed Red Flag symptoms and signs in detail. Patient expressed understanding regarding what to do in case of urgent or emergency type symptoms.  Medication list was reconciled, printed and provided to the patient in AVS. Patient instructions and summary information was reviewed with the patient as documented in the AVS. This note was prepared with assistance of Dragon voice recognition software. Occasional wrong-word or  sound-a-like substitutions may have occurred due to the inherent limitations of voice recognition software  This visit occurred during the SARS-CoV-2 public health emergency.  Safety protocols were in place, including screening questions prior to the visit, additional usage of staff PPE, and extensive cleaning of exam room while observing appropriate contact time as indicated for disinfecting solutions.

## 2022-01-24 ENCOUNTER — Ambulatory Visit
Admission: RE | Admit: 2022-01-24 | Discharge: 2022-01-24 | Disposition: A | Discharge status: Home / Self Care | Payer: BC Managed Care – PPO | Source: Ambulatory Visit | Attending: Family Medicine | Admitting: Family Medicine

## 2022-01-24 DIAGNOSIS — Z1231 Encounter for screening mammogram for malignant neoplasm of breast: Secondary | ICD-10-CM

## 2022-02-06 ENCOUNTER — Encounter: Payer: Self-pay | Admitting: Family Medicine

## 2022-02-06 ENCOUNTER — Ambulatory Visit (INDEPENDENT_AMBULATORY_CARE_PROVIDER_SITE_OTHER): Payer: BC Managed Care – PPO | Admitting: Family Medicine

## 2022-02-06 DIAGNOSIS — Z1159 Encounter for screening for other viral diseases: Secondary | ICD-10-CM

## 2022-02-06 DIAGNOSIS — R030 Elevated blood-pressure reading, without diagnosis of hypertension: Secondary | ICD-10-CM | POA: Diagnosis not present

## 2022-02-06 DIAGNOSIS — E282 Polycystic ovarian syndrome: Secondary | ICD-10-CM | POA: Diagnosis not present

## 2022-02-06 HISTORY — DX: Elevated blood-pressure reading, without diagnosis of hypertension: R03.0

## 2022-02-06 LAB — POCT GLYCOSYLATED HEMOGLOBIN (HGB A1C): Hemoglobin A1C: 5.5 % (ref 4.0–5.6)

## 2022-02-06 MED ORDER — WEGOVY 0.25 MG/0.5ML ~~LOC~~ SOAJ: 0.2500 mg | SUBCUTANEOUS | 0 refills | Status: DC

## 2022-02-06 NOTE — Patient Instructions (Signed)
Please follow up as scheduled for your next visit with me: 04/25/2022   I have ordered wegovy 0.25mg  weekly. Hopefully this will be covered.  If it is, then let me know in about a month and I will order the next dose up for you.   If it is not, we can discuss what is or how to manage this.   If you have any questions or concerns, please don't hesitate to send me a message via MyChart or call the office at [phone]. Thank you for visiting with [place] today! It's our pleasure caring for you.

## 2022-02-06 NOTE — Progress Notes (Signed)
Subjective  CC:  Chief Complaint  Patient presents with   Weight Loss    Pt would ;lie to discuss some options to help her loose weight. She walks about 3x per week and 3 mile each walk.     HPI: Erica Baxter is a 46 y.o. female who presents to the office today to address the problems listed above in the chief complaint. Obesity: Patient used phentermine for 1 month and felt it worked fairly well.  She reports she got down to 296 pounds.  She has been out for the last several weeks.  It did decrease appetite.  However her blood pressure is in the prehypertensive range.  She denies chest pain.  Wt Readings from Last 3 Encounters:  02/06/22 (!) 304 lb 9.6 oz (138.2 kg)  12/27/21 (!) 307 lb (139.3 kg)  08/19/18 280 lb 3.2 oz (127.1 kg)   BP Readings from Last 3 Encounters:  02/06/22 (!) 146/86  12/27/21 138/80  08/19/18 126/84    Assessment  1. Morbid obesity (HCC)   2. Prehypertension   3. PCOS (polycystic ovarian syndrome)   4. Need for hepatitis C screening test      Plan  Morbid obesity: Phentermine is not the best option given her prehypertension.  Therefore we will try to get GLP-1 agonist covered.  She believes [name] is on her formulary.  Education given.  Wegovy 0.25 mg weekly started.  Discussed possible side effects, risks and benefits.  She will continue diet and exercise.  She has follow-up with me for complete physical in less than 3 months. Prehypertension: She will start monitoring at home.  Will initiate blood pressure medications if blood pressures running high at home.  Recommend low-salt diet and weight loss PCOS: A1c today is in the normal range. Check lab work including lipid screen and TSH  Follow up: For complete physical as scheduled and follow-up obesity 04/25/2022  Orders Placed This Encounter  Procedures   CBC with Differential/Platelet   Lipid panel   TSH   Comprehensive metabolic panel   Hepatitis C antibody   No orders of the defined  types were placed in this encounter.     I reviewed the patients updated PMH, FH, and SocHx.    Patient Active Problem List   Diagnosis Date Noted   Morbid obesity (HCC) 02/06/2022   Prehypertension 02/06/2022   Iron deficiency anemia due to chronic blood loss 06/03/2018   Folate deficiency 06/03/2018   PCOS (polycystic ovarian syndrome) 04/22/2018   Pes planus 04/22/2018   Current Meds  Medication Sig   meloxicam (MOBIC) 15 MG tablet Take 15 mg by mouth daily.    Allergies: Patient is allergic to pineapple and shellfish allergy. Family History: Patient family history includes Asthma in her daughter and mother; COPD in her mother; Depression in her brother and mother; Diabetes in her mother and paternal grandmother; Early death in her mother; Heart attack in her son; Heart disease in her paternal grandfather; Prostate cancer in her maternal grandfather. Social History:  Patient  reports that she has never smoked. She has never used smokeless tobacco. She reports that she does not drink alcohol and does not use drugs.  Review of Systems: Constitutional: Negative for fever malaise or anorexia Cardiovascular: negative for chest pain Respiratory: negative for SOB or persistent cough Gastrointestinal: negative for abdominal pain  Objective  Vitals: BP (!) 146/86   Pulse (!) 102   Temp 98.7 F (37.1 C)   Ht 5\' 4"  (  1.626 m)   Wt (!) 304 lb 9.6 oz (138.2 kg)   SpO2 96%   BMI 52.28 kg/m  General: no acute distress , A&Ox3   Commons side effects, risks, benefits, and alternatives for medications and treatment plan prescribed today were discussed, and the patient expressed understanding of the given instructions. Patient is instructed to call or message via MyChart if he/she has any questions or concerns regarding our treatment plan. No barriers to understanding were identified. We discussed Red Flag symptoms and signs in detail. Patient expressed understanding regarding what to do  in case of urgent or emergency type symptoms.  Medication list was reconciled, printed and provided to the patient in AVS. Patient instructions and summary information was reviewed with the patient as documented in the AVS. This note was prepared with assistance of Dragon voice recognition software. Occasional wrong-word or sound-a-like substitutions may have occurred due to the inherent limitations of voice recognition software  This visit occurred during the SARS-CoV-2 public health emergency.  Safety protocols were in place, including screening questions prior to the visit, additional usage of staff PPE, and extensive cleaning of exam room while observing appropriate contact time as indicated for disinfecting solutions.

## 2022-02-07 ENCOUNTER — Other Ambulatory Visit: Payer: Self-pay | Admitting: Family Medicine

## 2022-02-07 ENCOUNTER — Encounter: Payer: Self-pay | Admitting: Family Medicine

## 2022-02-07 LAB — LIPID PANEL
Cholesterol: 152 mg/dL (ref 0–200)
HDL: 47.6 mg/dL (ref >39.00)
LDL Cholesterol: 80 mg/dL (ref 0–99)
NonHDL: 104.36
Total CHOL/HDL Ratio: 3
Triglycerides: 121 mg/dL (ref 0.0–149.0)
VLDL: 24.2 mg/dL (ref 0.0–40.0)

## 2022-02-07 LAB — CBC WITH DIFFERENTIAL/PLATELET
Basophils Absolute: 0 10*3/uL (ref 0.0–0.1)
Basophils Relative: 0.8 % (ref 0.0–3.0)
Eosinophils Absolute: 0.2 10*3/uL (ref 0.0–0.7)
Eosinophils Relative: 3.9 % (ref 0.0–5.0)
HCT: 39.5 % (ref 36.0–46.0)
Hemoglobin: 13.1 g/dL (ref 12.0–15.0)
Lymphocytes Relative: 35.9 % (ref 12.0–46.0)
Lymphs Abs: 1.9 10*3/uL (ref 0.7–4.0)
MCHC: 33.2 g/dL (ref 30.0–36.0)
MCV: 86.3 fl (ref 78.0–100.0)
Monocytes Absolute: 0.4 10*3/uL (ref 0.1–1.0)
Monocytes Relative: 8.1 % (ref 3.0–12.0)
Neutro Abs: 2.8 10*3/uL (ref 1.4–7.7)
Neutrophils Relative %: 51.3 % (ref 43.0–77.0)
Platelets: 233 10*3/uL (ref 150.0–400.0)
RBC: 4.58 Mil/uL (ref 3.87–5.11)
RDW: 13.6 % (ref 11.5–15.5)
WBC: 5.4 10*3/uL (ref 4.0–10.5)

## 2022-02-07 LAB — COMPREHENSIVE METABOLIC PANEL
ALT: 11 U/L (ref 0–35)
AST: 12 U/L (ref 0–37)
Albumin: 4.2 g/dL (ref 3.5–5.2)
Alkaline Phosphatase: 44 U/L (ref 39–117)
BUN: 12 mg/dL (ref 6–23)
CO2: 25 mEq/L (ref 19–32)
Calcium: 9.2 mg/dL (ref 8.4–10.5)
Chloride: 101 mEq/L (ref 96–112)
Creatinine, Ser: 0.81 mg/dL (ref 0.40–1.20)
GFR: 88.26 mL/min (ref >60.00)
Glucose, Bld: 100 mg/dL — ABNORMAL HIGH (ref 70–99)
Potassium: 3.8 mEq/L (ref 3.5–5.1)
Sodium: 138 mEq/L (ref 135–145)
Total Bilirubin: 0.3 mg/dL (ref 0.2–1.2)
Total Protein: 7 g/dL (ref 6.0–8.3)

## 2022-02-07 LAB — HEPATITIS C ANTIBODY: Hepatitis C Ab: NONREACTIVE

## 2022-02-07 LAB — TSH: TSH: 2.69 u[IU]/mL (ref 0.35–5.50)

## 2022-02-09 ENCOUNTER — Telehealth: Payer: Self-pay

## 2022-02-09 ENCOUNTER — Other Ambulatory Visit: Payer: Self-pay | Admitting: Family Medicine

## 2022-02-09 NOTE — Telephone Encounter (Signed)
Spoke with pt to let her know that [name] was denied by insurance company bc she had not been in a wt loss program for the past 85mos and she understood. Her next question is what is an alternative to help with this.

## 2022-02-13 ENCOUNTER — Other Ambulatory Visit: Payer: Self-pay | Admitting: Family Medicine

## 2022-02-13 ENCOUNTER — Telehealth: Payer: Self-pay

## 2022-02-13 NOTE — Telephone Encounter (Signed)
Spoke with pt to let her know that I received a response back for the Ozempic PA which was also denied bc the pt does not have Diabetes. I told the pt that she will probably have to stick with the phentermine to help with Wt loss and what what she is eating and add some moderate exercise to her daily plan. Pt stated that she has already gotten a refill on the phentermine and is continuing to take that as prescribed.

## 2022-03-01 NOTE — Telephone Encounter (Signed)
Working on appeal now.

## 2022-03-09 ENCOUNTER — Telehealth: Payer: Self-pay

## 2022-03-09 NOTE — Telephone Encounter (Signed)
Received appeal and it has also been denied stating that the pt has not participated in a wt loss program. I have informed the pt and she has decided to just stay on the phentermine for now. I advised the pt to let us know if anything else changes to where she would like to have a referral placed for wt loss.

## 2022-03-13 ENCOUNTER — Encounter: Payer: Self-pay | Admitting: *Deleted

## 2022-04-07 ENCOUNTER — Other Ambulatory Visit: Payer: Self-pay | Admitting: Family Medicine

## 2022-04-25 ENCOUNTER — Encounter: Payer: BC Managed Care – PPO | Admitting: Family Medicine

## 2022-04-26 ENCOUNTER — Ambulatory Visit (INDEPENDENT_AMBULATORY_CARE_PROVIDER_SITE_OTHER): Payer: BC Managed Care – PPO | Admitting: Family Medicine

## 2022-04-26 ENCOUNTER — Encounter: Payer: Self-pay | Admitting: Family Medicine

## 2022-04-26 VITALS — BP 120/86 | HR 85 | Temp 98.0°F | Ht 64.0 in | Wt 291.0 lb

## 2022-04-26 DIAGNOSIS — E282 Polycystic ovarian syndrome: Secondary | ICD-10-CM | POA: Diagnosis not present

## 2022-04-26 DIAGNOSIS — Z0000 Encounter for general adult medical examination without abnormal findings: Secondary | ICD-10-CM | POA: Diagnosis not present

## 2022-04-26 DIAGNOSIS — Z23 Encounter for immunization: Secondary | ICD-10-CM | POA: Diagnosis not present

## 2022-04-26 DIAGNOSIS — R030 Elevated blood-pressure reading, without diagnosis of hypertension: Secondary | ICD-10-CM

## 2022-04-26 MED ORDER — TIRZEPATIDE 2.5 MG/0.5ML ~~LOC~~ SOAJ: 2.5000 mg | SUBCUTANEOUS | 2 refills | Status: DC

## 2022-04-26 NOTE — Patient Instructions (Signed)
Please return in 3 months for recheck weight.   If you have any questions or concerns, please don't hesitate to send me a message via MyChart or call the office at [phone]. Thank you for visiting with Erica Baxter today! It's our pleasure caring for you.   Fat and Cholesterol Restricted Eating Plan Getting too much fat and cholesterol in your diet may cause health problems. Choosing the right foods helps keep your fat and cholesterol at normal levels. This can keep you from getting certain diseases. Your doctor may recommend an eating plan that includes: Total fat: ______% or less of total calories a day. This is ______g of fat a day. Saturated fat: ______% or less of total calories a day. This is ______g of saturated fat a day. Cholesterol: less than _________mg a day. Fiber: ______g a day. What are tips for following this plan? General tips Work with your doctor to lose weight if you need to. Avoid: Foods with added sugar. Fried foods. Foods with trans fat or partially hydrogenated oils. This includes some margarines and baked goods. If you drink alcohol: Limit how much you have to: 0-1 drink a day for women who are not pregnant. 0-2 drinks a day for men. Know how much alcohol is in a drink. In the [place] one drink equals one 12 oz bottle of beer (355 mL), one 5 oz glass of wine (148 mL), or one 1 oz glass of hard liquor (44 mL). Reading food labels Check food labels for: Trans fats. Partially hydrogenated oils. Saturated fat (g) in each serving. Cholesterol (mg) in each serving. Fiber (g) in each serving. Choose foods with healthy fats, such as: Monounsaturated fats and polyunsaturated fats. These include olive and canola oil, flaxseeds, walnuts, almonds, and seeds. Omega-3 fats. These are found in certain fish, flaxseed oil, and ground flaxseeds. Choose grain products that have whole grains. Look for the word "whole" as the first word in the ingredient list. Cooking Cook foods using  low-fat methods. These include baking, boiling, grilling, and broiling. Eat more home-cooked foods. Eat at restaurants and buffets less often. Eat less fast food. Avoid cooking using saturated fats, such as butter, cream, palm oil, palm kernel oil, and coconut oil. Meal planning  At meals, divide your plate into four equal parts: Fill one-half of your plate with vegetables, green salads, and fruit. Fill one-fourth of your plate with whole grains. Fill one-fourth of your plate with low-fat (lean) protein foods. Eat fish that is high in omega-3 fats at least two times a week. This includes mackerel, tuna, sardines, and salmon. Eat foods that are high in fiber, such as whole grains, beans, apples, pears, berries, broccoli, carrots, peas, and barley. What foods should I eat? Fruits All fresh, canned (in natural juice), or frozen fruits. Vegetables Fresh or frozen vegetables (raw, steamed, roasted, or grilled). Green salads. Grains Whole grains, such as whole wheat or whole grain breads, crackers, cereals, and pasta. Unsweetened oatmeal, bulgur, barley, quinoa, or brown rice. Corn or whole wheat flour tortillas. Meats and other protein foods Ground beef (85% or leaner), grass-fed beef, or beef trimmed of fat. Skinless chicken or [place]. Ground chicken or [place]. Pork trimmed of fat. All fish and seafood. Egg whites. Dried beans, peas, or lentils. Unsalted nuts or seeds. Unsalted canned beans. Nut butters without added sugar or oil. Dairy Low-fat or nonfat dairy products, such as skim or 1% milk, 2% or reduced-fat cheeses, low-fat and fat-free ricotta or cottage cheese, or plain low-fat and nonfat  yogurt. Fats and oils Tub margarine without trans fats. Light or reduced-fat mayonnaise and salad dressings. Avocado. Olive, canola, sesame, or safflower oils. The items listed above may not be a complete list of foods and beverages you can eat. Contact a dietitian for more information. What foods should  I avoid? Fruits Canned fruit in heavy syrup. Fruit in cream or butter sauce. Fried fruit. Vegetables Vegetables cooked in cheese, cream, or butter sauce. Fried vegetables. Grains White bread. White pasta. White rice. Cornbread. Bagels, pastries, and croissants. Crackers and snack foods that contain trans fat and hydrogenated oils. Meats and other protein foods Fatty cuts of meat. Ribs, chicken wings, bacon, sausage, bologna, salami, chitterlings, fatback, hot dogs, bratwurst, and packaged lunch meats. Liver and organ meats. Whole eggs and egg yolks. Chicken and [place] with skin. Fried meat. Dairy Whole or 2% milk, cream, half-and-half, and cream cheese. Whole milk cheeses. Whole-fat or sweetened yogurt. Full-fat cheeses. Nondairy creamers and whipped toppings. Processed cheese, cheese spreads, and cheese curds. Fats and oils Butter, stick margarine, lard, shortening, ghee, or bacon fat. Coconut, palm kernel, and palm oils. Beverages Alcohol. Sugar-sweetened drinks such as sodas, lemonade, and fruit drinks. Sweets and desserts Corn syrup, sugars, honey, and molasses. Candy. Jam and jelly. Syrup. Sweetened cereals. Cookies, pies, cakes, donuts, muffins, and ice cream. The items listed above may not be a complete list of foods and beverages you should avoid. Contact a dietitian for more information. Summary Choosing the right foods helps keep your fat and cholesterol at normal levels. This can keep you from getting certain diseases. At meals, fill one-half of your plate with vegetables, green salads, and fruits. Eat high fiber foods, like whole grains, beans, apples, pears, berries, carrots, peas, and barley. Limit added sugar, saturated fats, alcohol, and fried foods. This information is not intended to replace advice given to you by your health care provider. Make sure you discuss any questions you have with your health care provider. Document Revised: 10/15/2020 Document Reviewed:  10/15/2020 Elsevier Patient Education  2023 [employer].

## 2022-04-26 NOTE — Progress Notes (Signed)
Subjective  Chief Complaint  Patient presents with   Annual Exam    Pt had no questions/concerns. Pt declined flu shot    HPI: Erica Baxter is a 46 y.o. female who presents to [facility] [place] today for a Female Wellness Visit. She also has the concerns and/or needs as listed above in the chief complaint. These will be addressed in addition to the Health Maintenance Visit.   Wellness Visit: annual visit with health maintenance review and exam without Pap  HM: screens are current. Eligible for flu and covid booster. Eating well. Happy.  Chronic disease f/u and/or acute problem visit: (deemed necessary to be done in addition to the wellness visit): Obesity: phentermine and diet: has lost 15 pounds. [name] was denied due to not being on a "nutrition plan". However, she is still very much interested in trying glp-1 for weight loss. See the PA and appeal for detailed information on her weight loss journey. No CI.  PCOS H/o prehypertension: on phentermine and bp is steady; improved. Weight loss has helped Reviewed lab results from August. See below.   Assessment  1. Annual physical exam   2. PCOS (polycystic ovarian syndrome)   3. Morbid obesity (HCC)   4. Prehypertension   5. Need for immunization against influenza      Plan  Female Wellness Visit: Age appropriate Health Maintenance and Prevention measures were discussed with patient. Included topics are cancer screening recommendations, ways to keep healthy (see AVS) including dietary and exercise recommendations, regular eye and dental care, use of seat belts, and avoidance of moderate alcohol use and tobacco use.  BMI: discussed patient's BMI and encouraged positive lifestyle modifications to help get to or maintain a target BMI. HM needs and immunizations were addressed and ordered. See below for orders. See HM and immunization section for updates. Routine labs and screening tests ordered including cmp, cbc  and lipids where appropriate. Discussed recommendations regarding Vit D and calcium supplementation (see AVS)  Chronic disease management visit and/or acute problem visit: Morbid obesity: good GLP-1 candidate: educated. Mounjaro 2.5mg  sample box provided.  Bp is stable. Will monitor. Low sodium diet and weight loss should keep it normal.  Flu shot updated today.   Follow up: 3 mo for weight recheck  Orders Placed This Encounter  Procedures   Flu Vaccine QUAD 79mo+IM (Fluarix, Fluzone & Alfiuria Quad PF)   Meds ordered this encounter  Medications   tirzepatide (MOUNJARO) 2.5 MG/0.5ML Pen    Sig: Inject 2.5 mg into the skin once a week.    Dispense:  2 mL    Refill:  2      Body mass index is 49.95 kg/m. Wt Readings from Last 3 Encounters:  04/26/22 291 lb (132 kg)  02/06/22 (!) 304 lb 9.6 oz (138.2 kg)  12/27/21 (!) 307 lb (139.3 kg)     Patient Active Problem List   Diagnosis Date Noted   Morbid obesity (HCC) 02/06/2022   Prehypertension 02/06/2022   Iron deficiency anemia due to chronic blood loss 06/03/2018   Folate deficiency 06/03/2018   PCOS (polycystic ovarian syndrome) 04/22/2018   Pes planus 04/22/2018   Health Maintenance  Topic Date Due   COVID-19 Vaccine (1) Never done   MAMMOGRAM  01/25/2023   PAP SMEAR-Modifier  04/23/2023   TETANUS/TDAP  05/31/2028   INFLUENZA VACCINE  Completed   Hepatitis C Screening  Completed   HIV Screening  Completed   HPV VACCINES  Aged  Out   Immunization History  Administered Date(s) Administered   Influenza,inj,Quad PF,6+ Mos 04/04/2018, 04/26/2022   Tdap 05/31/2018   We updated and reviewed the patient's past history in detail and it is documented below. Allergies: Patient is allergic to pineapple and shellfish allergy. Past Medical History Patient  has a past medical history of Ovarian cyst and PCOS (polycystic ovarian syndrome). Past Surgical History Patient  has a past surgical history that includes Tubal  ligation; Dilation and curettage of uterus; Wisdom tooth extraction; Cervical cerclage; and Endometrial ablation (2020). Family History: Patient family history includes Asthma in her daughter and mother; COPD in her mother; Depression in her brother and mother; Diabetes in her mother and paternal grandmother; Early death in her mother; Heart attack in her son; Heart disease in her paternal grandfather; Prostate cancer in her maternal grandfather. Social History:  Patient  reports that she has never smoked. She has never used smokeless tobacco. She reports that she does not drink alcohol and does not use drugs.  Review of Systems: Constitutional: negative for fever or malaise Ophthalmic: negative for photophobia, double vision or loss of vision Cardiovascular: negative for chest pain, dyspnea on exertion, or new LE swelling Respiratory: negative for SOB or persistent cough Gastrointestinal: negative for abdominal pain, change in bowel habits or melena Genitourinary: negative for dysuria or gross hematuria, no abnormal uterine bleeding or disharge Musculoskeletal: negative for new gait disturbance or muscular weakness Integumentary: negative for new or persistent rashes, no breast lumps Neurological: negative for TIA or stroke symptoms Psychiatric: negative for SI or delusions Allergic/Immunologic: negative for hives  Patient Care Team    Relationship Specialty Notifications Start End  Willow Ora, MD PCP - General Family Medicine  04/22/18   Edwinna Areola, DO Consulting Physician Obstetrics and Gynecology  08/19/18     Objective  Vitals: BP 120/86 (BP Location: Left Arm, Patient Position: Sitting)   Pulse 85   Temp 98 F (36.7 C) (Temporal)   Ht 5\' 4"  (1.626 m)   Wt 291 lb (132 kg)   LMP 04/07/2022 (Exact Date)   SpO2 99%   BMI 49.95 kg/m  General:  Well developed, well nourished, no acute distress  Psych:  Alert and orientedx3,normal mood and affect HEENT:   Normocephalic, atraumatic, non-icteric sclera,  supple neck without adenopathy, mass or thyromegaly Cardiovascular:  Normal S1, S2, RRR without gallop, rub or murmur Respiratory:  Good breath sounds bilaterally, CTAB with normal respiratory effort Gastrointestinal: normal bowel sounds, soft, non-tender, no noted masses. No HSM MSK: no deformities, contusions. Joints are without erythema or swelling.  Skin:  Warm, no rashes or suspicious lesions noted Neurologic:    Mental status is normal. CN 2-11 are normal. Gross motor and sensory exams are normal. Normal gait. No tremor  No visits with results within 1 Day(s) from this visit.  Latest known visit with results is:  Office Visit on 02/06/2022  Component Date Value Ref Range Status   WBC 02/06/2022 5.4  4.0 - 10.5 K/uL Final   RBC 02/06/2022 4.58  3.87 - 5.11 Mil/uL Final   Hemoglobin 02/06/2022 13.1  12.0 - 15.0 g/dL Final   HCT 02/08/2022 39.5  36.0 - 46.0 % Final   MCV 02/06/2022 86.3  78.0 - 100.0 fl Final   MCHC 02/06/2022 33.2  30.0 - 36.0 g/dL Final   RDW 02/08/2022 13.6  11.5 - 15.5 % Final   Platelets 02/06/2022 233.0  150.0 - 400.0 K/uL Final   Neutrophils Relative %  02/06/2022 51.3  43.0 - 77.0 % Final   Lymphocytes Relative 02/06/2022 35.9  12.0 - 46.0 % Final   Monocytes Relative 02/06/2022 8.1  3.0 - 12.0 % Final   Eosinophils Relative 02/06/2022 3.9  0.0 - 5.0 % Final   Basophils Relative 02/06/2022 0.8  0.0 - 3.0 % Final   Neutro Abs 02/06/2022 2.8  1.4 - 7.7 K/uL Final   Lymphs Abs 02/06/2022 1.9  0.7 - 4.0 K/uL Final   Monocytes Absolute 02/06/2022 0.4  0.1 - 1.0 K/uL Final   Eosinophils Absolute 02/06/2022 0.2  0.0 - 0.7 K/uL Final   Basophils Absolute 02/06/2022 0.0  0.0 - 0.1 K/uL Final   Cholesterol 02/06/2022 152  0 - 200 mg/dL Final   Triglycerides 38/03/1750 121.0  0.0 - 149.0 mg/dL Final   HDL 02/58/5277 47.60  >39.00 mg/dL Final   VLDL 82/42/3536 24.2  0.0 - 40.0 mg/dL Final   LDL Cholesterol 02/06/2022 80   0 - 99 mg/dL Final   Total CHOL/HDL Ratio 02/06/2022 3   Final   NonHDL 02/06/2022 104.36   Final   TSH 02/06/2022 2.69  0.35 - 5.50 uIU/mL Final   Sodium 02/06/2022 138  135 - 145 mEq/L Final   Potassium 02/06/2022 3.8  3.5 - 5.1 mEq/L Final   Chloride 02/06/2022 101  96 - 112 mEq/L Final   CO2 02/06/2022 25  19 - 32 mEq/L Final   Glucose, Bld 02/06/2022 100 (H)  70 - 99 mg/dL Final   BUN 14/43/1540 12  6 - 23 mg/dL Final   Creatinine, Ser 02/06/2022 0.81  0.40 - 1.20 mg/dL Final   Total Bilirubin 02/06/2022 0.3  0.2 - 1.2 mg/dL Final   Alkaline Phosphatase 02/06/2022 44  39 - 117 U/L Final   AST 02/06/2022 12  0 - 37 U/L Final   ALT 02/06/2022 11  0 - 35 U/L Final   Total Protein 02/06/2022 7.0  6.0 - 8.3 g/dL Final   Albumin 08/67/6195 4.2  3.5 - 5.2 g/dL Final   GFR 09/32/6712 88.26  >60.00 mL/min Final   Calcium 02/06/2022 9.2  8.4 - 10.5 mg/dL Final   Hepatitis C Ab 02/06/2022 NON-REACTIVE  NON-REACTIVE Final   Hemoglobin A1C 02/06/2022 5.5  4.0 - 5.6 % Final     Commons side effects, risks, benefits, and alternatives for medications and treatment plan prescribed today were discussed, and the patient expressed understanding of the given instructions. Patient is instructed to call or message via MyChart if he/she has any questions or concerns regarding our treatment plan. No barriers to understanding were identified. We discussed Red Flag symptoms and signs in detail. Patient expressed understanding regarding what to do in case of urgent or emergency type symptoms.  Medication list was reconciled, printed and provided to the patient in AVS. Patient instructions and summary information was reviewed with the patient as documented in the AVS. This note was prepared with assistance of Dragon voice recognition software. Occasional wrong-word or sound-a-like substitutions may have occurred due to the inherent limitations of voice recognition software

## 2022-06-02 ENCOUNTER — Encounter: Payer: Self-pay | Admitting: Family Medicine

## 2022-06-06 ENCOUNTER — Other Ambulatory Visit: Payer: Self-pay | Admitting: Family Medicine

## 2022-06-06 MED ORDER — ZEPBOUND 5 MG/0.5ML ~~LOC~~ SOAJ: 5.0000 mg | SUBCUTANEOUS | 2 refills | Status: DC

## 2022-06-21 MED ORDER — TIRZEPATIDE 5 MG/0.5ML ~~LOC~~ SOAJ: 5.0000 mg | SUBCUTANEOUS | 2 refills | Status: DC

## 2022-06-21 NOTE — Addendum Note (Signed)
Addended by: Billey Chang on: 06/21/2022 12:10 PM   Modules accepted: Orders

## 2022-07-27 ENCOUNTER — Encounter: Payer: Self-pay | Admitting: Family Medicine

## 2022-07-27 ENCOUNTER — Ambulatory Visit: Payer: BC Managed Care – PPO | Admitting: Family Medicine

## 2022-07-27 DIAGNOSIS — R030 Elevated blood-pressure reading, without diagnosis of hypertension: Secondary | ICD-10-CM | POA: Diagnosis not present

## 2022-07-27 DIAGNOSIS — E282 Polycystic ovarian syndrome: Secondary | ICD-10-CM

## 2022-07-27 MED ORDER — TIRZEPATIDE 10 MG/0.5ML ~~LOC~~ SOAJ: 10.0000 mg | SUBCUTANEOUS | 5 refills | Status: DC

## 2022-07-27 NOTE — Patient Instructions (Signed)
Please return in 6 months for your annual complete physical; please come fasting.   If you have any questions or concerns, please don't hesitate to send me a message via MyChart or call the office at [phone]. Thank you for visiting with us today! It's our pleasure caring for you.  

## 2022-07-27 NOTE — Progress Notes (Signed)
Subjective  CC:  Chief Complaint  Patient presents with   Morbid obesit    When the pt came in Nov she was at 70 and now she is down to 275.0lbs    HPI: Erica Baxter is a 47 y.o. female who presents to the office today to address the problems listed above in the chief complaint. Obesity: Patient here for follow-up for weight loss.  We started Mounjaro 2.5 mg weekly back in November.  Over the last month she has been on Mounjaro 5 mg.  She is tolerating it well.  Initially had some belching and reflux but that has completely resolved.  She denies nausea, constipation or diarrhea.  Is helping her significantly with decreased appetite, decreased craving, able to make better food choices.  Her weight is down almost 20 pounds.  She denies hair thinning or abdominal pain.  She is very happy about her weight loss.  Starting an exercise program. Prehypertension: In August she had an elevated blood pressure reading with follow-up in November showing an elevated diastolic.  Today her blood pressure looks perfect with weight loss.  She feels well  Wt Readings from Last 3 Encounters:  07/27/22 275 lb 3.2 oz (124.8 kg)  04/26/22 291 lb (132 kg)  02/06/22 (!) 304 lb 9.6 oz (138.2 kg)   BP Readings from Last 3 Encounters:  07/27/22 110/70  04/26/22 120/86  02/06/22 (!) 146/86     Assessment  1. Morbid obesity (Erica Baxter)   2. Prehypertension   3. PCOS (polycystic ovarian syndrome)      Plan  Morbid obesity: Good response to Mounjaro.  Will continue.  She will complete this month at 5 mg weekly and then titrate up to 7.5 mg weekly.  Monitor for side effects.  Will continue that dose for at least 3 months.  She will let me know how she is doing with her weight loss and symptom control.  Medications refilled today.  She is self-pay at this time. PCOS: Predisposes her to obesity.  Erica Baxter is helping with insulin resistance.  Will recheck lab work in August History of prehypertension: Now resolved  with weight loss.  Continue low-sodium diet  Follow up: 6 months for complete physical and follow-up Visit date not found  No orders of the defined types were placed in this encounter.  Meds ordered this encounter  Medications   tirzepatide (MOUNJARO) 10 MG/0.5ML Pen    Sig: Inject 10 mg into the skin once a week.    Dispense:  2 mL    Refill:  5      I reviewed the patients updated PMH, FH, and SocHx.    Patient Active Problem List   Diagnosis Date Noted   Morbid obesity ([place]) 02/06/2022   Prehypertension 02/06/2022   Iron deficiency anemia due to chronic blood loss 06/03/2018   Folate deficiency 06/03/2018   PCOS (polycystic ovarian syndrome) 04/22/2018   Pes planus 04/22/2018   Current Meds  Medication Sig   tirzepatide (MOUNJARO) 10 MG/0.5ML Pen Inject 10 mg into the skin once a week.    Allergies: Patient is allergic to pineapple and shellfish allergy. Family History: Patient family history includes Asthma in her daughter and mother; COPD in her mother; Depression in her brother and mother; Diabetes in her mother and paternal grandmother; Early death in her mother; Heart attack in her son; Heart disease in her paternal grandfather; Prostate cancer in her maternal grandfather. Social History:  Patient  reports that she has never  smoked. She has never used smokeless tobacco. She reports that she does not drink alcohol and does not use drugs.  Review of Systems: Constitutional: Negative for fever malaise or anorexia Cardiovascular: negative for chest pain Respiratory: negative for SOB or persistent cough Gastrointestinal: negative for abdominal pain  Objective  Vitals: BP 110/70   Pulse 70   Temp 97.8 F (36.6 C)   Ht 5\' 4"  (1.626 m)   Wt 275 lb 3.2 oz (124.8 kg)   SpO2 99%   BMI 47.24 kg/m  General: no acute distress , A&Ox3   Commons side effects, risks, benefits, and alternatives for medications and treatment plan prescribed today were discussed, and the  patient expressed understanding of the given instructions. Patient is instructed to call or message via MyChart if he/she has any questions or concerns regarding our treatment plan. No barriers to understanding were identified. We discussed Red Flag symptoms and signs in detail. Patient expressed understanding regarding what to do in case of urgent or emergency type symptoms.  Medication list was reconciled, printed and provided to the patient in AVS. Patient instructions and summary information was reviewed with the patient as documented in the AVS. This note was prepared with assistance of Dragon voice recognition software. Occasional wrong-word or sound-a-like substitutions may have occurred due to the inherent limitations of voice recognition software

## 2022-10-31 ENCOUNTER — Other Ambulatory Visit: Payer: Self-pay | Admitting: Family Medicine

## 2022-11-06 ENCOUNTER — Encounter: Payer: Self-pay | Admitting: Family Medicine

## 2022-11-06 MED ORDER — PHENTERMINE HCL 37.5 MG PO TABS: 37.5000 mg | ORAL_TABLET | Freq: Every day | ORAL | 2 refills | Status: DC

## 2023-02-08 ENCOUNTER — Other Ambulatory Visit: Payer: Self-pay | Admitting: Family Medicine

## 2023-03-13 ENCOUNTER — Other Ambulatory Visit: Payer: Self-pay | Admitting: Family

## 2023-05-01 ENCOUNTER — Other Ambulatory Visit (HOSPITAL_COMMUNITY)
Admission: RE | Admit: 2023-05-01 | Discharge: 2023-05-01 | Disposition: A | Discharge status: Home / Self Care | Payer: BC Managed Care – PPO | Source: Ambulatory Visit | Attending: Family Medicine | Admitting: Family Medicine

## 2023-05-01 ENCOUNTER — Encounter: Payer: Self-pay | Admitting: Family Medicine

## 2023-05-01 ENCOUNTER — Ambulatory Visit (INDEPENDENT_AMBULATORY_CARE_PROVIDER_SITE_OTHER): Payer: BC Managed Care – PPO | Admitting: Family Medicine

## 2023-05-01 VITALS — BP 114/80 | HR 51 | Temp 97.9°F | Ht 64.0 in | Wt 255.2 lb

## 2023-05-01 DIAGNOSIS — E282 Polycystic ovarian syndrome: Secondary | ICD-10-CM

## 2023-05-01 DIAGNOSIS — Z1231 Encounter for screening mammogram for malignant neoplasm of breast: Secondary | ICD-10-CM

## 2023-05-01 DIAGNOSIS — Z0000 Encounter for general adult medical examination without abnormal findings: Secondary | ICD-10-CM | POA: Diagnosis not present

## 2023-05-01 DIAGNOSIS — Z124 Encounter for screening for malignant neoplasm of cervix: Secondary | ICD-10-CM | POA: Diagnosis present

## 2023-05-01 DIAGNOSIS — Z1211 Encounter for screening for malignant neoplasm of colon: Secondary | ICD-10-CM

## 2023-05-01 DIAGNOSIS — Z0001 Encounter for general adult medical examination with abnormal findings: Secondary | ICD-10-CM

## 2023-05-01 LAB — CBC WITH DIFFERENTIAL/PLATELET
Basophils Absolute: 0.1 10*3/uL (ref 0.0–0.1)
Basophils Relative: 2 % (ref 0.0–3.0)
Eosinophils Absolute: 0.1 10*3/uL (ref 0.0–0.7)
Eosinophils Relative: 2.6 % (ref 0.0–5.0)
HCT: 39.4 % (ref 36.0–46.0)
Hemoglobin: 13.2 g/dL (ref 12.0–15.0)
Lymphocytes Relative: 33.9 % (ref 12.0–46.0)
Lymphs Abs: 1.9 10*3/uL (ref 0.7–4.0)
MCHC: 33.6 g/dL (ref 30.0–36.0)
MCV: 88.1 fL (ref 78.0–100.0)
Monocytes Absolute: 0.3 10*3/uL (ref 0.1–1.0)
Monocytes Relative: 5.6 % (ref 3.0–12.0)
Neutro Abs: 3.2 10*3/uL (ref 1.4–7.7)
Neutrophils Relative %: 55.9 % (ref 43.0–77.0)
Platelets: 260 10*3/uL (ref 150.0–400.0)
RBC: 4.47 Mil/uL (ref 3.87–5.11)
RDW: 13.9 % (ref 11.5–15.5)
WBC: 5.7 10*3/uL (ref 4.0–10.5)

## 2023-05-01 LAB — COMPREHENSIVE METABOLIC PANEL
ALT: 10 U/L (ref 0–35)
AST: 15 U/L (ref 0–37)
Albumin: 4.1 g/dL (ref 3.5–5.2)
Alkaline Phosphatase: 46 U/L (ref 39–117)
BUN: 13 mg/dL (ref 6–23)
CO2: 25 meq/L (ref 19–32)
Calcium: 9.1 mg/dL (ref 8.4–10.5)
Chloride: 104 meq/L (ref 96–112)
Creatinine, Ser: 0.88 mg/dL (ref 0.40–1.20)
GFR: 79.22 mL/min (ref >60.00)
Glucose, Bld: 72 mg/dL (ref 70–99)
Potassium: 3.8 meq/L (ref 3.5–5.1)
Sodium: 137 meq/L (ref 135–145)
Total Bilirubin: 0.4 mg/dL (ref 0.2–1.2)
Total Protein: 7 g/dL (ref 6.0–8.3)

## 2023-05-01 LAB — LIPID PANEL
Cholesterol: 136 mg/dL (ref 0–200)
HDL: 47 mg/dL (ref >39.00)
LDL Cholesterol: 75 mg/dL (ref 0–99)
NonHDL: 89.3
Total CHOL/HDL Ratio: 3
Triglycerides: 73 mg/dL (ref 0.0–149.0)
VLDL: 14.6 mg/dL (ref 0.0–40.0)

## 2023-05-01 LAB — TSH: TSH: 1.59 u[IU]/mL (ref 0.35–5.50)

## 2023-05-01 NOTE — Progress Notes (Signed)
Subjective  Chief Complaint  Patient presents with   Annual Exam    Pt here for Annual Exam and is currently fasting. Pap/mammo has not been scheduled yet.   Obesity    HPI: Erica Baxter is a 47 y.o. female who presents to [facility] today for a Female Wellness Visit. She also has the concerns and/or needs as listed above in the chief complaint. These will be addressed in addition to the Health Maintenance Visit.   Wellness Visit: annual visit with health maintenance review and exam  HM: due mammo, pap and CRC screen, avg risk. Doing great overall.  Chronic disease f/u and/or acute problem visit: (deemed necessary to be done in addition to the wellness visit): Discussed the use of AI scribe software for clinical note transcription with the patient, who gave verbal consent to proceed.  History of Present Illness   The patient, previously on weight loss injections, discontinued due to cost and switched back to phentermine. She subsequently transitioned to semaglutide, obtained from MD exam, and has been on it for a while. The patient reports a weight loss of approximately 60 pounds since the initiation of the weight loss regimen. She continues to engage in physical activities such as gym workouts and walking.  H/o IDA 2020 s/p ablation; now with regular cycles. has pcos.   The patient has been due for routine screenings including mammogram, Pap smear, and colon cancer screening. She has no family history of colon cancer and has never had a colonoscopy before.  The patient has not reported any new symptoms or changes in her health status since the last visit. She has not started any new medications or supplements. She has not received a flu shot and has expressed a preference not to receive one.       Assessment  1. Encounter for well adult exam with abnormal findings   2. Morbid obesity (HCC)   3. PCOS (polycystic ovarian syndrome)   4. Screening for  colorectal cancer   5. Cervical cancer screening   6. Screening mammogram for breast cancer   7. Iron deficiency anemia due to chronic blood loss      Plan  Female Wellness Visit: Age appropriate Health Maintenance and Prevention measures were discussed with patient. Included topics are cancer screening recommendations, ways to keep healthy (see AVS) including dietary and exercise recommendations, regular eye and dental care, use of seat belts, and avoidance of moderate alcohol use and tobacco use.  BMI: discussed patient's BMI and encouraged positive lifestyle modifications to help get to or maintain a target BMI. HM needs and immunizations were addressed and ordered. See below for orders. See HM and immunization section for updates. Routine labs and screening tests ordered including cmp, cbc and lipids where appropriate. Discussed recommendations regarding Vit D and calcium supplementation (see AVS)  Chronic disease management visit and/or acute problem visit: Assessment and Plan    Obesity Managing obesity with semaglutide after discontinuing Mounjaro due to cost. Lost 60 pounds and continues to exercise regularly. Prefers semaglutide over phentermine. - Continue semaglutide 40 CCs - Encourage continued exercise and healthy diet  Menorrhagia Ongoing menstrual cycles, not as heavy as before. Previously had an ablation procedure. No current heavy bleeding issues.  General Health Maintenance Due for several routine screenings. No family history of colon cancer. Blood pressure is well-controlled. Discussed colonoscopy and Cologuard as screening options. Colonoscopy is preferred for biopsy capability but requires more preparation. Cologuard is a good  alternative for average risk, checks for abnormal DNA, and if negative, good for three years. Patient prefers colonoscopy. - Order mammogram - Perform Pap smear - Refer to GI for colonoscopy - Order basic blood work - Discuss flu shot;  patient declined due to past adverse reactions  Follow-up - Follow up on GI referral - Review lab results and communicate any concerns.       Follow up: 12 mo cpe  Orders Placed This Encounter  Procedures   MM DIGITAL SCREENING BILATERAL   CBC with Differential/Platelet   Comprehensive metabolic panel   Lipid panel   TSH   Ambulatory referral to Gastroenterology   No orders of the defined types were placed in this encounter.     Body mass index is 43.8 kg/m. Wt Readings from Last 3 Encounters:  05/01/23 255 lb 3.2 oz (115.8 kg)  07/27/22 275 lb 3.2 oz (124.8 kg)  04/26/22 291 lb (132 kg)     Patient Active Problem List   Diagnosis Date Noted Date Diagnosed   Morbid obesity (HCC) 02/06/2022    Iron deficiency anemia due to chronic blood loss 06/03/2018     2020: heavy menses; s/p ablation Dr. Mindi Slicker    PCOS (polycystic ovarian syndrome) 04/22/2018    Pes planus 04/22/2018    Health Maintenance  Topic Date Due   Colonoscopy  Never done   MAMMOGRAM  01/25/2023   Cervical Cancer Screening (HPV/Pap Cotest)  04/23/2023   COVID-19 Vaccine (1) 05/17/2023 (Originally [birth year]1982)   INFLUENZA VACCINE  09/17/2023 (Originally 01/18/2023)   DTaP/Tdap/Td (2 - Td or Tdap) 05/31/2028   Hepatitis C Screening  Completed   HIV Screening  Completed   HPV VACCINES  Aged Out   Immunization History  Administered Date(s) Administered   Influenza,inj,Quad PF,6+ Mos 04/04/2018, 04/26/2022   Tdap 05/31/2018   We updated and reviewed the patient's past history in detail and it is documented below. Allergies: Patient is allergic to pineapple and shellfish allergy. Past Medical History Patient  has a past medical history of Folate deficiency (06/03/2018), Ovarian cyst, PCOS (polycystic ovarian syndrome), and Prehypertension (02/06/2022). Past Surgical History Patient  has a past surgical history that includes Tubal ligation; Dilation and curettage of uterus; Wisdom tooth extraction;  Cervical cerclage; and Endometrial ablation (2020). Family History: Patient family history includes Asthma in her daughter and mother; COPD in her mother; Depression in her brother and mother; Diabetes in her mother and paternal grandmother; Early death in her mother; Heart attack in her son; Heart disease in her paternal grandfather; Prostate cancer in her maternal grandfather. Social History:  Patient  reports that she has never smoked. She has never used smokeless tobacco. She reports that she does not drink alcohol and does not use drugs.  Review of Systems: Constitutional: negative for fever or malaise Ophthalmic: negative for photophobia, double vision or loss of vision Cardiovascular: negative for chest pain, dyspnea on exertion, or new LE swelling Respiratory: negative for SOB or persistent cough Gastrointestinal: negative for abdominal pain, change in bowel habits or melena Genitourinary: negative for dysuria or gross hematuria, no abnormal uterine bleeding or disharge Musculoskeletal: negative for new gait disturbance or muscular weakness Integumentary: negative for new or persistent rashes, no breast lumps Neurological: negative for TIA or stroke symptoms Psychiatric: negative for SI or delusions Allergic/Immunologic: negative for hives  Patient Care Team    Relationship Specialty Notifications Start End  Willow Ora, MD PCP - General Family Medicine  04/22/18   Edwinna Areola,  DO Consulting Physician Obstetrics and Gynecology  08/19/18     Objective  Vitals: BP 114/80   Pulse (!) 51   Temp 97.9 F (36.6 C)   Ht 5\' 4"  (1.626 m)   Wt 255 lb 3.2 oz (115.8 kg)   SpO2 97%   BMI 43.80 kg/m  General:  Well developed, well nourished, no acute distress  Psych:  Alert and orientedx3,normal mood and affect HEENT:  Normocephalic, atraumatic, non-icteric sclera,  supple neck without adenopathy, mass or thyromegaly Cardiovascular:  Normal S1, S2, RRR without gallop, rub or  murmur Respiratory:  Good breath sounds bilaterally, CTAB with normal respiratory effort Gastrointestinal: normal bowel sounds, soft, non-tender, no noted masses. No HSM MSK: extremities without edema, joints without erythema or swelling Neurologic:    Mental status is normal.  Gross motor and sensory exams are normal.  No tremor Pelvic Exam: Normal external genitalia, no vulvar or vaginal lesions present. mulitparous cervix with white d/c w/o CMT. Bimanual exam reveals a nontender fundus w/o masses, nl size. No adnexal masses present. No inguinal adenopathy. A PAP smear was performed.    Commons side effects, risks, benefits, and alternatives for medications and treatment plan prescribed today were discussed, and the patient expressed understanding of the given instructions. Patient is instructed to call or message via MyChart if he/she has any questions or concerns regarding our treatment plan. No barriers to understanding were identified. We discussed Red Flag symptoms and signs in detail. Patient expressed understanding regarding what to do in case of urgent or emergency type symptoms.  Medication list was reconciled, printed and provided to the patient in AVS. Patient instructions and summary information was reviewed with the patient as documented in the AVS. This note was prepared with assistance of Dragon voice recognition software. Occasional wrong-word or sound-a-like substitutions may have occurred due to the inherent limitations of voice recognition software

## 2023-05-01 NOTE — Patient Instructions (Signed)
Please return in 12 months for your annual complete physical; please come fasting.   I will release your lab results to you on your MyChart account with further instructions. You may see the results before I do, but when I review them I will send you a message with my report or have my assistant call you if things need to be discussed. Please reply to my message with any questions. Thank you!   If you have any questions or concerns, please don't hesitate to send me a message via MyChart or call the office at [phone]. Thank you for visiting with [place] today! It's our pleasure caring for you.   We will call you with information regarding your referral appointment. [facility] for colonoscopy. If you do not hear from [place] within the next 2 weeks, please let me know. It can take 1-2 weeks to get appointments set up with the specialists.   Please call the office checked below to schedule your appointment for your mammogram and/or bone density screen (the checked studies were ordered): [x]   Mammogram  []   Bone Density  [x]   [facility]     [address] [place], [place]        [phone]         []   [facility]  [address] [place], [place]  [phone]

## 2023-05-04 LAB — CYTOLOGY - PAP
Comment: NEGATIVE
Diagnosis: NEGATIVE
High risk HPV: NEGATIVE

## 2023-05-10 NOTE — Progress Notes (Signed)
Labs reviewed. All normal and normal pap The 10-year ASCVD risk score (Arnett DK, et al., 2019) is: 0.6%   Values used to calculate the score:     Age: 47 years     Sex: Female     Is Non-Hispanic African American: Yes     Diabetic: No     Tobacco smoker: No     Systolic Blood Pressure: 114 mmHg     Is BP treated: No     HDL Cholesterol: 47 mg/dL     Total Cholesterol: 136 mg/dL  Dear Erica Baxter, Thank you for allowing me to care for you at your recent office visit.  I wanted to let you know that I have reviewed your lab test results and am happy to report that they are all normal.  Your pap smear is normal as well and is good for 5 years.  Please get with GI to set up your colonoscopy.  So glad you are doing well.  Sincerely, Dr. Mardelle Matte

## 2023-07-05 ENCOUNTER — Encounter: Payer: Self-pay | Admitting: Gastroenterology

## 2023-07-19 ENCOUNTER — Ambulatory Visit: Payer: 59

## 2023-07-19 VITALS — Ht 68.0 in | Wt 256.0 lb

## 2023-07-19 DIAGNOSIS — Z1211 Encounter for screening for malignant neoplasm of colon: Secondary | ICD-10-CM

## 2023-07-19 MED ORDER — SUFLAVE 178.7 G PO SOLR: 1.0000 | ORAL | 0 refills | Status: AC

## 2023-07-19 NOTE — Progress Notes (Signed)

## 2023-07-20 ENCOUNTER — Encounter: Payer: Self-pay | Admitting: Gastroenterology

## 2023-08-08 ENCOUNTER — Encounter: Payer: Self-pay | Admitting: Gastroenterology

## 2023-08-10 ENCOUNTER — Encounter: Payer: Self-pay | Admitting: Gastroenterology

## 2023-08-10 ENCOUNTER — Ambulatory Visit (AMBULATORY_SURGERY_CENTER): Payer: 59 | Admitting: Gastroenterology

## 2023-08-10 VITALS — BP 106/66 | HR 73 | Temp 98.2°F | Resp 14 | Ht 68.0 in | Wt 256.0 lb

## 2023-08-10 DIAGNOSIS — Z1211 Encounter for screening for malignant neoplasm of colon: Secondary | ICD-10-CM

## 2023-08-10 MED ORDER — SODIUM CHLORIDE 0.9 % IV SOLN: 500.0000 mL | Freq: Once | INTRAVENOUS | Status: DC

## 2023-08-10 MED ORDER — NA SULFATE-K SULFATE-MG SULF 17.5-3.13-1.6 GM/177ML PO SOLN
1.0000 | Freq: Once | ORAL | 0 refills | Status: AC
Start: 2023-08-10 — End: 2023-08-10

## 2023-08-10 NOTE — Progress Notes (Signed)
 Pt's states no medical or surgical changes since previsit or office visit.

## 2023-08-10 NOTE — Op Note (Signed)
[facility] Patient Name: Erica Baxter Procedure Date: 08/10/2023 12:49 PM MRN: [id number] Endoscopist: Napoleon Form , MD, [id number] Age: 48 Referring MD:  Date of Birth: 1976-05-05 Gender: Female Account #: [id number] Procedure:                Colonoscopy Indications:              Screening for colorectal malignant neoplasm Medicines:                Monitored Anesthesia Care Procedure:                Pre-Anesthesia Assessment:                           - Prior to the procedure, a History and Physical                            was performed, and patient medications and                            allergies were reviewed. The patient's tolerance of                            previous anesthesia was also reviewed. The risks                            and benefits of the procedure and the sedation                            options and risks were discussed with the patient.                            All questions were answered, and informed consent                            was obtained. Prior Anticoagulants: The patient has                            taken no anticoagulant or antiplatelet agents. ASA                            Grade Assessment: II - A patient with mild systemic                            disease. After reviewing the risks and benefits,                            the patient was deemed in satisfactory condition to                            undergo the procedure.                           After obtaining informed consent, the colonoscope  was passed under direct vision. Throughout the                            procedure, the patient's blood pressure, pulse, and                            oxygen saturations were monitored continuously. The                            Olympus Scope [id number] was introduced through the                            anus with the intention of advancing to the cecum.                            The scope  was advanced to the sigmoid colon before                            the procedure was aborted. Medications were given.                            The colonoscopy was technically difficult and                            complex due to poor bowel prep with stool present.                            The patient tolerated the procedure well. The                            quality of the bowel preparation was poor. The                            rectum was photographed. Scope In: 1:10:10 PM Scope Out: 1:12:33 PM Total Procedure Duration: 0 hours 2 minutes 23 seconds  Findings:                 The perianal and digital rectal examinations were                            normal.                           A moderate amount of stool was found in the rectum,                            in the sigmoid colon and in the descending colon,                            interfering with visualization. Aborted procedure. Complications:            No immediate complications. Estimated Blood Loss:     Estimated blood loss: none. Impression:               - Preparation  of the colon was poor.                           - Stool in the rectum, in the sigmoid colon and in                            the descending colon. Aborted procedure.                           - No specimens collected. Recommendation:           - Resume previous diet.                           - Continue present medications.                           - Repeat colonoscopy at the next available                            appointment because the bowel preparation was                            suboptimal.                           - For future colonoscopy the patient will require                            an extended preparation. If there are any                            questions, please contact the gastroenterologist. Napoleon Form, MD 08/10/2023 1:17:51 PM This report has been signed electronically.

## 2023-08-10 NOTE — Progress Notes (Signed)
 A/O x 3, gd SR's, VSS, report to RN

## 2023-08-10 NOTE — Progress Notes (Signed)
[facility] History and Physical   Primary Care Physician:  Willow Ora, MD   Reason for Procedure:  Colorectal cancer screening  Plan:    Screening colonoscopy with possible interventions as needed     HPI: Erica Baxter is a very pleasant 48 y.o. female here for screening colonoscopy. Denies any nausea, vomiting, abdominal pain, melena or bright red blood per rectum  The risks and benefits as well as alternatives of endoscopic procedure(s) have been discussed and reviewed. All questions answered. The patient agrees to proceed.    Past Medical History:  Diagnosis Date   Folate deficiency 06/03/2018   Ovarian cyst    PCOS (polycystic ovarian syndrome)    Prehypertension 02/06/2022    Past Surgical History:  Procedure Laterality Date   CERVICAL CERCLAGE     X  3 pregnancies   DILATION AND CURETTAGE OF UTERUS     ENDOMETRIAL ABLATION  2020   Dr. Mindi Slicker   TUBAL LIGATION     WISDOM TOOTH EXTRACTION      Prior to Admission medications   Medication Sig Start Date End Date Taking? Authorizing Provider  PEG 3350-KCl-NaCl-NaSulf-MgSul (SUFLAVE) 178.7 g SOLR Take 1 kit by mouth as directed. 07/19/23  Yes Ryaan Vanwagoner, Eleonore Chiquito, MD  meloxicam (MOBIC) 15 MG tablet Take 15 mg by mouth daily. 12/18/21   [provider]  Semaglutide, 1 MG/DOSE, 4 MG/3ML SOPN Inject 1 mg as directed once a week. 05/01/23   Willow Ora, MD    Current Outpatient Medications  Medication Sig Dispense Refill   PEG 3350-KCl-NaCl-NaSulf-MgSul (SUFLAVE) 178.7 g SOLR Take 1 kit by mouth as directed. 1 each 0   meloxicam (MOBIC) 15 MG tablet Take 15 mg by mouth daily.     Semaglutide, 1 MG/DOSE, 4 MG/3ML SOPN Inject 1 mg as directed once a week.     Current Facility-Administered Medications  Medication Dose Route Frequency Provider Last Rate Last Admin   0.9 %  sodium chloride infusion  500 mL Intravenous Once Derrian Rodak, Eleonore Chiquito, MD        Allergies as of 08/10/2023 - Review  Complete 08/10/2023  Allergen Reaction Noted   Pineapple Swelling 07/05/2011   Shellfish allergy Swelling 07/05/2011   Shellfish-derived products Swelling 07/19/2023    Family History  Problem Relation Age of Onset   Asthma Mother    COPD Mother    Depression Mother    Diabetes Mother    Early death Mother    Depression Brother    Prostate cancer Maternal Grandfather    Diabetes Paternal Grandmother    Heart disease Paternal Grandfather    Asthma Daughter    Heart attack Son    Colon cancer Neg Hx    Rectal cancer Neg Hx    Stomach cancer Neg Hx    Esophageal cancer Neg Hx     Social History   Socioeconomic History   Marital status: Married    Spouse name: Not on file   Number of children: Not on file   Years of education: Not on file   Highest education level: Not on file  Occupational History   Occupation: Landscape architect  Tobacco Use   Smoking status: Never   Smokeless tobacco: Never  Vaping Use   Vaping status: Never Used  Substance and Sexual Activity   Alcohol use: No   Drug use: No   Sexual activity: Yes    Birth control/protection: Surgical  Other Topics Concern   Not on file  Social History Narrative   Not on file   Social Drivers of Health   Financial Resource Strain: Not on file  Food Insecurity: Not on file  Transportation Needs: Not on file  Physical Activity: Not on file  Stress: Not on file  Social Connections: Not on file  Intimate Partner Violence: Not on file    Review of Systems:  All other review of systems negative except as mentioned in the HPI.  Physical Exam: Vital signs in last 24 hours: BP (!) 138/91   Pulse 82   Temp 98.2 F (36.8 C)   Resp (!) 25   Ht 5\' 8"  (1.727 m)   Wt 256 lb (116.1 kg)   LMP  (LMP Unknown)   SpO2 100%   BMI 38.92 kg/m  General:   Alert, NAD Lungs:  Clear .   Heart:  Regular rate and rhythm Abdomen:  Soft, nontender and nondistended. Neuro/Psych:  Alert and cooperative. Normal mood and  affect. A and O x 3  Reviewed labs, radiology imaging, old records and pertinent past GI work up  Patient is appropriate for planned procedure(s) and anesthesia in an ambulatory setting   K. Scherry Ran , MD [phone]

## 2023-08-10 NOTE — Patient Instructions (Addendum)
Resume all of your current medications as ordered.  Read all of your discharge instructions.  Read all of your prep instructions for your colonoscopy on the 26th. Be sure to follow all of the directions in your prep paper.  YOU HAD AN ENDOSCOPIC PROCEDURE TODAY AT THE [facility]:   Refer to the procedure report that was given to you for any specific questions about what was found during the examination.  If the procedure report does not answer your questions, please call your gastroenterologist to clarify.  If you requested that your care partner not be given the details of your procedure findings, then the procedure report has been included in a sealed envelope for you to review at your convenience later.  YOU SHOULD EXPECT: Some feelings of bloating in the abdomen. Passage of more gas than usual.  Walking can help get rid of the air that was put into your GI tract during the procedure and reduce the bloating. If you had a lower endoscopy (such as a colonoscopy or flexible sigmoidoscopy) you may notice spotting of blood in your stool or on the toilet paper. If you underwent a bowel prep for your procedure, you may not have a normal bowel movement for a few days.  Please Note:  You might notice some irritation and congestion in your nose or some drainage.  This is from the oxygen used during your procedure.  There is no need for concern and it should clear up in a day or so.  SYMPTOMS TO REPORT IMMEDIATELY:  Following lower endoscopy (colonoscopy or flexible sigmoidoscopy):  Excessive amounts of blood in the stool  Significant tenderness or worsening of abdominal pains  Swelling of the abdomen that is new, acute  Fever of 100F or higher    For urgent or emergent issues, a gastroenterologist can be reached at any hour by calling [phone]. Do not use MyChart messaging for urgent concerns.    DIET:  We do recommend a small meal at first, but then you may proceed to your  regular diet.  Drink plenty of fluids but you should avoid alcoholic beverages for 24 hours.  ACTIVITY:  You should plan to take it easy for the rest of today and you should NOT DRIVE or use heavy machinery until tomorrow (because of the sedation medicines used during the test).    FOLLOW UP: Our staff will call the number listed on your records the next business day following your procedure.  We will call around 7:15- 8:00 am to check on you and address any questions or concerns that you may have regarding the information given to you following your procedure. If we do not reach you, we will leave a message.       SIGNATURES/CONFIDENTIALITY: You and/or your care partner have signed paperwork which will be entered into your electronic medical record.  These signatures attest to the fact that that the information above on your After Visit Summary has been reviewed and is understood.  Full responsibility of the confidentiality of this discharge information lies with you and/or your care-partner.

## 2023-08-12 ENCOUNTER — Encounter: Payer: Self-pay | Admitting: Family Medicine

## 2023-08-12 ENCOUNTER — Encounter: Payer: Self-pay | Admitting: Certified Registered Nurse Anesthetist

## 2023-08-13 ENCOUNTER — Telehealth: Payer: Self-pay

## 2023-08-13 NOTE — Telephone Encounter (Signed)
 Left message on follow up call.

## 2023-08-14 ENCOUNTER — Telehealth: Payer: Self-pay | Admitting: Gastroenterology

## 2023-08-14 NOTE — Telephone Encounter (Signed)
 ok

## 2023-08-14 NOTE — Telephone Encounter (Signed)
 Good morning Dr. Lavon Paganini,   Patient called stating she needed to cancel her colonoscopy that was scheduled for tomorrow 2/26 at 8:00 due to her husband not being able to take off work to bring her.   Patient states she will call back at a later time to reschedule.

## 2023-08-15 ENCOUNTER — Encounter: Payer: 59 | Admitting: Gastroenterology

## 2023-08-17 ENCOUNTER — Other Ambulatory Visit: Payer: Self-pay

## 2023-08-17 DIAGNOSIS — Z1211 Encounter for screening for malignant neoplasm of colon: Secondary | ICD-10-CM

## 2023-08-31 LAB — COLOGUARD: COLOGUARD: NEGATIVE

## 2023-09-03 ENCOUNTER — Encounter: Payer: Self-pay | Admitting: Family Medicine

## 2023-09-03 NOTE — Progress Notes (Signed)
Negative cologuard. Mc note sent

## 2024-01-22 ENCOUNTER — Telehealth: Payer: Self-pay | Admitting: Family Medicine

## 2024-01-22 NOTE — Telephone Encounter (Signed)
 Called patient and left vm regarding scheduling a mammogram. Requested call back at earliest convenience.

## 2024-03-26 ENCOUNTER — Other Ambulatory Visit: Payer: Self-pay | Admitting: Family Medicine

## 2024-03-26 DIAGNOSIS — Z1231 Encounter for screening mammogram for malignant neoplasm of breast: Secondary | ICD-10-CM

## 2024-04-03 ENCOUNTER — Ambulatory Visit
Admission: RE | Admit: 2024-04-03 | Discharge: 2024-04-03 | Disposition: A | Discharge status: Home / Self Care | Source: Ambulatory Visit | Attending: Family Medicine | Admitting: Family Medicine

## 2024-04-03 DIAGNOSIS — Z1231 Encounter for screening mammogram for malignant neoplasm of breast: Secondary | ICD-10-CM

## 2024-05-01 ENCOUNTER — Ambulatory Visit: Payer: BC Managed Care – PPO | Admitting: Family Medicine

## 2024-05-01 ENCOUNTER — Encounter: Payer: Self-pay | Admitting: Family Medicine

## 2024-05-01 VITALS — BP 100/80 | HR 75 | Temp 97.7°F | Ht 68.0 in | Wt 271.4 lb

## 2024-05-01 DIAGNOSIS — Z0000 Encounter for general adult medical examination without abnormal findings: Secondary | ICD-10-CM | POA: Diagnosis not present

## 2024-05-01 DIAGNOSIS — D500 Iron deficiency anemia secondary to blood loss (chronic): Secondary | ICD-10-CM

## 2024-05-01 DIAGNOSIS — E282 Polycystic ovarian syndrome: Secondary | ICD-10-CM

## 2024-05-01 DIAGNOSIS — Z23 Encounter for immunization: Secondary | ICD-10-CM | POA: Diagnosis not present

## 2024-05-01 LAB — LIPID PANEL
Cholesterol: 159 mg/dL (ref 0–200)
HDL: 50.2 mg/dL (ref >39.00)
LDL Cholesterol: 93 mg/dL (ref 0–99)
NonHDL: 108.5
Total CHOL/HDL Ratio: 3
Triglycerides: 79 mg/dL (ref 0.0–149.0)
VLDL: 15.8 mg/dL (ref 0.0–40.0)

## 2024-05-01 LAB — CBC WITH DIFFERENTIAL/PLATELET
Basophils Absolute: 0 K/uL (ref 0.0–0.1)
Basophils Relative: 0.9 % (ref 0.0–3.0)
Eosinophils Absolute: 0.1 K/uL (ref 0.0–0.7)
Eosinophils Relative: 2.4 % (ref 0.0–5.0)
HCT: 41.5 % (ref 36.0–46.0)
Hemoglobin: 14 g/dL (ref 12.0–15.0)
Lymphocytes Relative: 36.2 % (ref 12.0–46.0)
Lymphs Abs: 1.4 K/uL (ref 0.7–4.0)
MCHC: 33.8 g/dL (ref 30.0–36.0)
MCV: 86.3 fl (ref 78.0–100.0)
Monocytes Absolute: 0.2 K/uL (ref 0.1–1.0)
Monocytes Relative: 6.2 % (ref 3.0–12.0)
Neutro Abs: 2.1 K/uL (ref 1.4–7.7)
Neutrophils Relative %: 54.3 % (ref 43.0–77.0)
Platelets: 241 K/uL (ref 150.0–400.0)
RBC: 4.81 Mil/uL (ref 3.87–5.11)
RDW: 13.1 % (ref 11.5–15.5)
WBC: 3.8 K/uL — ABNORMAL LOW (ref 4.0–10.5)

## 2024-05-01 LAB — COMPREHENSIVE METABOLIC PANEL WITH GFR
ALT: 10 U/L (ref 0–35)
AST: 10 U/L (ref 0–37)
Albumin: 4.3 g/dL (ref 3.5–5.2)
Alkaline Phosphatase: 43 U/L (ref 39–117)
BUN: 11 mg/dL (ref 6–23)
CO2: 26 meq/L (ref 19–32)
Calcium: 9.2 mg/dL (ref 8.4–10.5)
Chloride: 103 meq/L (ref 96–112)
Creatinine, Ser: 0.81 mg/dL (ref 0.40–1.20)
GFR: 86.89 mL/min (ref >60.00)
Glucose, Bld: 73 mg/dL (ref 70–99)
Potassium: 4.6 meq/L (ref 3.5–5.1)
Sodium: 138 meq/L (ref 135–145)
Total Bilirubin: 0.5 mg/dL (ref 0.2–1.2)
Total Protein: 6.9 g/dL (ref 6.0–8.3)

## 2024-05-01 LAB — VITAMIN B12: Vitamin B-12: 370 pg/mL (ref 211–911)

## 2024-05-01 LAB — HEMOGLOBIN A1C: Hgb A1c MFr Bld: 5.4 % (ref 4.6–6.5)

## 2024-05-01 LAB — VITAMIN D 25 HYDROXY (VIT D DEFICIENCY, FRACTURES): VITD: 10.77 ng/mL — ABNORMAL LOW (ref 30.00–100.00)

## 2024-05-01 LAB — TSH: TSH: 1.04 u[IU]/mL (ref 0.35–5.50)

## 2024-05-01 NOTE — Progress Notes (Signed)
 Subjective  Chief Complaint  Patient presents with   Annual Exam    Pt here for Annual Exam and is currently fasting     HPI: Erica Baxter is a 48 y.o. female who presents to [facility] [place] today for a Female Wellness Visit. She also has the concerns and/or needs as listed above in the chief complaint. These will be addressed in addition to the Health Maintenance Visit.   Wellness Visit: annual visit with health maintenance review and exam  HM: reviewed colonoscopy from last year: inadequate prep so changed to cologuard: negative. Mammo and pap current. Doing well.   Chronic disease f/u and/or acute problem visit: (deemed necessary to be done in addition to the wellness visit): Discussed the use of AI scribe software for clinical note transcription with the patient, who gave verbal consent to proceed.  History of Present Illness Erica Baxter is a 48 year old female who presents for follow-up regarding her medication regimen and colonoscopy results.   Weight management and antidiabetic medication use - Currently taking tirzepatide , started on March 29, 2024, after a four-month hiatus from semaglutide . - Obtains tirzepatide  from an online service (MD exam) and takes a dose of 12.5 units. - Experiences mild queasiness when taking tirzepatide  at night, with no significant side effects. - While on semaglutide , appetite was better controlled but weight did not decrease significantly. - No other supplements or medications in use.  Menstrual irregularities and perimenopausal symptoms and h/o IDA. - History of polycystic ovary syndrome (PCOS) and perimenopausal status. - Underwent uterine laser procedure for heavy menstrual bleeding, resulting in slowed but not completely resolved bleeding. - Menstrual cycles remain regular with reduced bleeding post-procedure. - Not currently taking iron  supplementation.  Physical activity - Maintains an active lifestyle,  waking at 5:30 AM to attend the gym regularly.    Assessment  1. Annual physical exam   2. Need for influenza vaccination   3. Morbid obesity (HCC)   4. PCOS (polycystic ovarian syndrome)   5. Iron  deficiency anemia due to chronic blood loss      Plan  Female Wellness Visit: Age appropriate Health Maintenance and Prevention measures were discussed with patient. Included topics are cancer screening recommendations, ways to keep healthy (see AVS) including dietary and exercise recommendations, regular eye and dental care, use of seat belts, and avoidance of moderate alcohol use and tobacco use.  BMI: discussed patient's BMI and encouraged positive lifestyle modifications to help get to or maintain a target BMI. HM needs and immunizations were addressed and ordered. See below for orders. See HM and immunization section for updates. Flu shot today Routine labs and screening tests ordered including cmp, cbc and lipids where appropriate. Discussed recommendations regarding Vit D and calcium supplementation (see AVS)  Chronic disease management visit and/or acute problem visit: Assessment and Plan Assessment & Plan Obesity Management with tirzepatide  initiated in October 2025. Previous semaglutide  use was ineffective for weight loss. Current dose is 12.5 mg, which is higher than typical starting doses. Concerns about the source of medication and dosing strategy. Mild queasiness reported as a side effect. Discussion of potential benefits of GLP-1 receptor agonists for weight loss and appetite control. Consideration of GLP-1 and GLP-2 combination therapy for better efficacy and reduced side effects. - Continue tirzepatide  12.5 mg. - Monitor for side effects and efficacy. - Consider GLP-1 and GLP-2 combination therapy if needed. - creatine and digestive enzyme recommendations. - Encouraged regular exercise and healthy diet,  and protein  Polycystic Ovary Syndrome (PCOS) PCOS management with GLP-1  receptor agonists. Discussion of the role of these medications in managing PCOS symptoms and hormone regulation. - Continue current management with GLP-1 receptor agonists.  H/o menorrhagia and IDA: improved. Recheck levels. Still with regular menses in spite of ablation Not taking oral iron  now.  General Health Maintenance Discussion of general health maintenance including recent flu vaccination and potential future interventions for skin tightening and fat reduction post-weight loss. - Continue routine health maintenance. - Consider skin tightening and fat reduction interventions post-weight loss.    Follow up: 12 mo for cpe  Orders Placed This Encounter  Procedures   Flu vaccine trivalent PF, 6mos and older(Flulaval,Afluria,Fluarix,Fluzone)   TSH   VITAMIN D 25 Hydroxy (Vit-D Deficiency, Fractures)   CBC with Differential/Platelet   Comprehensive metabolic panel with GFR   Lipid panel   Vitamin B12   Iron , TIBC and Ferritin Panel   Hemoglobin A1c   No orders of the defined types were placed in this encounter.     Body mass index is 41.27 kg/m. Wt Readings from Last 3 Encounters:  05/01/24 271 lb 6.4 oz (123.1 kg)  08/10/23 256 lb (116.1 kg)  07/19/23 256 lb (116.1 kg)     Patient Active Problem List   Diagnosis Date Noted   Morbid obesity (HCC) 02/06/2022   Iron  deficiency anemia due to chronic blood loss 06/03/2018    2020: heavy menses; s/p ablation Dr. Delana    PCOS (polycystic ovarian syndrome) 04/22/2018   Pes planus 04/22/2018   Health Maintenance  Topic Date Due   Hepatitis B Vaccines 19-59 Average Risk (1 of 3 - 19+ 3-dose series) Never done   Influenza Vaccine  01/18/2024   COVID-19 Vaccine (1) 05/17/2024 (Originally 08/22/1980)   Mammogram  04/03/2025   Fecal DNA (Cologuard)  08/23/2026   Cervical Cancer Screening (HPV/Pap Cotest)  04/30/2028   DTaP/Tdap/Td (2 - Td or Tdap) 05/31/2028   Hepatitis C Screening  Completed   HIV Screening  Completed    Pneumococcal Vaccine  Aged Out   HPV VACCINES  Aged Out   Meningococcal B Vaccine  Aged Out   Colonoscopy  Discontinued   Immunization History  Administered Date(s) Administered   Influenza,inj,Quad PF,6+ Mos 04/04/2018, 04/26/2022   Tdap 05/31/2018   We updated and reviewed the patient's past history in detail and it is documented below. Allergies: Patient is allergic to pineapple, shellfish allergy, and shellfish protein-containing drug products. Past Medical History Patient  has a past medical history of Folate deficiency (06/03/2018), Ovarian cyst, PCOS (polycystic ovarian syndrome), and Prehypertension (02/06/2022). Past Surgical History Patient  has a past surgical history that includes Tubal ligation; Dilation and curettage of uterus; Wisdom tooth extraction; Cervical cerclage; and Endometrial ablation (2020). Family History: Patient family history includes Asthma in her daughter and mother; COPD in her mother; Depression in her brother and mother; Diabetes in her mother and paternal grandmother; Early death in her mother; Heart attack in her son; Heart disease in her paternal grandfather; Prostate cancer in her maternal grandfather. Social History:  Patient  reports that she has never smoked. She has never used smokeless tobacco. She reports that she does not drink alcohol and does not use drugs.  Review of Systems: Constitutional: negative for fever or malaise Ophthalmic: negative for photophobia, double vision or loss of vision Cardiovascular: negative for chest pain, dyspnea on exertion, or new LE swelling Respiratory: negative for SOB or persistent cough Gastrointestinal: negative for  abdominal pain, change in bowel habits or melena Genitourinary: negative for dysuria or gross hematuria, no abnormal uterine bleeding or disharge Musculoskeletal: negative for new gait disturbance or muscular weakness Integumentary: negative for new or persistent rashes, no breast  lumps Neurological: negative for TIA or stroke symptoms Psychiatric: negative for SI or delusions Allergic/Immunologic: negative for hives  Patient Care Team    Relationship Specialty Notifications Start End  Jodie Lavern CROME, MD PCP - General Family Medicine  04/22/18   Delana Ted Morrison, DO Consulting Physician Obstetrics and Gynecology  08/19/18   Shila Gustav GAILS, MD Consulting Physician Gastroenterology  08/17/23     Objective  Vitals: BP 100/80   Pulse 75   Temp 97.7 F (36.5 C)   Ht 5' 8 (1.727 m)   Wt 271 lb 6.4 oz (123.1 kg)   SpO2 98%   BMI 41.27 kg/m  General:  Well developed, well nourished, no acute distress  Psych:  Alert and orientedx3,normal mood and affect HEENT:  Normocephalic, atraumatic, non-icteric sclera,  supple neck without adenopathy, mass or thyromegaly Cardiovascular:  Normal S1, S2, RRR without gallop, rub or murmur Respiratory:  Good breath sounds bilaterally, CTAB with normal respiratory effort Gastrointestinal: normal bowel sounds, soft, non-tender, no noted masses. No HSM MSK: extremities without edema, joints without erythema or swelling Neurologic:    Mental status is normal.  Gross motor and sensory exams are normal.  No tremor  Commons side effects, risks, benefits, and alternatives for medications and treatment plan prescribed today were discussed, and the patient expressed understanding of the given instructions. Patient is instructed to call or message via MyChart if he/she has any questions or concerns regarding our treatment plan. No barriers to understanding were identified. We discussed Red Flag symptoms and signs in detail. Patient expressed understanding regarding what to do in case of urgent or emergency type symptoms.  Medication list was reconciled, printed and provided to the patient in AVS. Patient instructions and summary information was reviewed with the patient as documented in the AVS. This note was prepared with assistance of  Dragon voice recognition software. Occasional wrong-word or sound-a-like substitutions may have occurred due to the inherent limitations of voice recognition software

## 2024-05-01 NOTE — Patient Instructions (Signed)
 Please return in 12 months for your annual complete physical; please come fasting.   I will release your lab results to you on your MyChart account with further instructions. You may see the results before I do, but when I review them I will send you a message with my report or have my assistant call you if things need to be discussed. Please reply to my message with any questions. Thank you!   If you have any questions or concerns, please don't hesitate to send me a message via MyChart or call the office at [phone]. Thank you for visiting with us  today! It's our pleasure caring for you.   Please do these things to maintain good health!  Exercise at least 30-45 minutes a day,  4-5 days a week.  Eat a low-fat diet with lots of fruits and vegetables, up to 7-9 servings per day. Drink plenty of water daily. Try to drink 8 8oz glasses per day. Seatbelts can save your life. Always wear your seatbelt. Place Smoke Detectors on every level of your home and check batteries every year. Schedule an appointment with an eye doctor for an eye exam every 1-2 years Safe sex - use condoms to protect yourself from STDs if you could be exposed to these types of infections. Use birth control if you do not want to become pregnant and are sexually active. Avoid heavy alcohol use. If you drink, keep it to less than 2 drinks/day and not every day. Health Care Power of Attorney.  Choose someone you trust that could speak for you if you became unable to speak for yourself. Depression is common in our stressful world.If you're feeling down or losing interest in things you normally enjoy, please come in for a visit. If anyone is threatening or hurting you, please get help. Physical or Emotional Violence is never OK.

## 2024-05-02 ENCOUNTER — Ambulatory Visit: Payer: Self-pay | Admitting: Family Medicine

## 2024-05-02 LAB — IRON,TIBC AND FERRITIN PANEL
%SAT: 26 % (ref 16–45)
Ferritin: 91 ng/mL (ref 16–232)
Iron: 83 ug/dL (ref 40–190)
TIBC: 315 ug/dL (ref 250–450)

## 2024-05-02 NOTE — Progress Notes (Signed)
 See mychart note

## 2024-05-17 ENCOUNTER — Encounter: Payer: Self-pay | Admitting: Gastroenterology

## 2025-05-04 ENCOUNTER — Encounter: Admitting: Family Medicine
